# Patient Record
Sex: Female | Born: 1970 | Race: Black or African American | Hispanic: No | Marital: Single | Smoking: Never smoker
Health system: Southern US, Community
[De-identification: ages and names within clinical notes are randomized; demographics above are authoritative.]

## PROBLEM LIST (undated history)

## (undated) DIAGNOSIS — J302 Other seasonal allergic rhinitis: Secondary | ICD-10-CM

## (undated) DIAGNOSIS — D649 Anemia, unspecified: Secondary | ICD-10-CM

## (undated) DIAGNOSIS — I10 Essential (primary) hypertension: Secondary | ICD-10-CM

## (undated) HISTORY — PX: COLONOSCOPY: SHX174

## (undated) HISTORY — PX: DILATION AND CURETTAGE OF UTERUS: SHX78

## (undated) HISTORY — PX: OTHER SURGICAL HISTORY: SHX169

## (undated) HISTORY — PX: WISDOM TOOTH EXTRACTION: SHX21

## (undated) HISTORY — PX: TONSILLECTOMY: SUR1361

---

## 2017-01-20 ENCOUNTER — Ambulatory Visit (INDEPENDENT_AMBULATORY_CARE_PROVIDER_SITE_OTHER): Payer: Self-pay | Admitting: Family Medicine

## 2017-01-20 VITALS — BP 128/84 | HR 82 | Temp 98.5°F | Resp 17 | Ht 67.5 in | Wt 295.0 lb

## 2017-01-20 DIAGNOSIS — J069 Acute upper respiratory infection, unspecified: Secondary | ICD-10-CM

## 2017-01-20 DIAGNOSIS — E782 Mixed hyperlipidemia: Secondary | ICD-10-CM

## 2017-01-20 DIAGNOSIS — E785 Hyperlipidemia, unspecified: Secondary | ICD-10-CM | POA: Insufficient documentation

## 2017-01-20 DIAGNOSIS — I10 Essential (primary) hypertension: Secondary | ICD-10-CM

## 2017-01-20 NOTE — Patient Instructions (Addendum)
We recommend that you schedule a mammogram for breast cancer screening. Typically, you do not need a referral to do this. Please contact a local imaging center to schedule your mammogram.  [facility] - [phone]  *ask for the Radiology Department [facility]) - [phone] or [phone]  [facility] - [phone] [place] [phone] [facility] - [phone]  *ask for the [facility] - [phone]  *ask for the Radiology Department [facility] - [phone]  *ask for the [place] - [phone]    IF you received an x-ray today, you will receive an invoice from [facility] Radiology. Please contact [facility] Radiology at [phone] with questions or concerns regarding your invoice.   IF you received labwork today, you will receive an invoice from [facility]. Please contact [facility] at [phone] with questions or concerns regarding your invoice.   Our billing staff will not be able to assist you with questions regarding bills from these companies.  You will be contacted with the lab results as soon as they are available. The fastest way to get your results is to activate your My Chart account. Instructions are located on the last page of this paperwork. If you have not heard from [place] regarding the results in 2 weeks, please contact this office.      Upper Respiratory Infection, Adult Most upper respiratory infections (URIs) are caused by a virus. A URI affects the nose, throat, and upper air passages. The most common type of URI is often called "the common cold." Follow these instructions at home:  Take medicines only as told by your doctor.  Gargle warm saltwater or take cough drops to comfort your throat as told by your doctor.  Use a warm mist humidifier or inhale steam from a shower to increase air  moisture. This may make it easier to breathe.  Drink enough fluid to keep your pee (urine) clear or pale yellow.  Eat soups and other clear broths.  Have a healthy diet.  Rest as needed.  Go back to work when your fever is gone or your doctor says it is okay.  You may need to stay home longer to avoid giving your URI to others.  You can also wear a face mask and wash your hands often to prevent spread of the virus.  Use your inhaler more if you have asthma.  Do not use any tobacco products, including cigarettes, chewing tobacco, or electronic cigarettes. If you need help quitting, ask your doctor. Contact a doctor if:  You are getting worse, not better.  Your symptoms are not helped by medicine.  You have chills.  You are getting more short of breath.  You have brown or red mucus.  You have yellow or brown discharge from your nose.  You have pain in your face, especially when you bend forward.  You have a fever.  You have puffy (swollen) neck glands.  You have pain while swallowing.  You have white areas in the back of your throat. Get help right away if:  You have very bad or constant:  Headache.  Ear pain.  Pain in your forehead, behind your eyes, and over your cheekbones (sinus pain).  Chest pain.  You have long-lasting (chronic) lung disease and any of the following:  Wheezing.  Long-lasting cough.  Coughing up blood.  A change in your usual mucus.  You have  a stiff neck.  You have changes in your:  Vision.  Hearing.  Thinking.  Mood. This information is not intended to replace advice given to you by your health care provider. Make sure you discuss any questions you have with your health care provider. Document Released: 05/23/2008 Document Revised: 08/07/2016 Document Reviewed: 03/12/2014 Elsevier Interactive Patient Education  2017 [employer].

## 2017-01-20 NOTE — Progress Notes (Signed)
  Chief Complaint  Patient presents with  . Cough  . Nasal Congestion  . URI    HPI  Upper Respiratory Infection: Patient complains of symptoms of a URI. Symptoms include sore throat. Onset of symptoms was 5 days ago, gradually worsening since that time.  She reports that she used afrin spray for nasal congestion. She also c/o cough described as nonproductive and nasal congestion for the past 3 days .  She is drinking plenty of fluids. Evaluation to date: none. Treatment to date: nasal spray with afrin.  She received her flu vaccine.  She reports that she has felt better since using the nasal spray - no diarrhea - no vomiting - no myalgias - no palpitations  No past medical history on file.  Current Outpatient Prescriptions  Medication Sig Dispense Refill  . aspirin 81 MG chewable tablet Chew by mouth daily.    Marland Kitchen lisinopril (PRINIVIL,ZESTRIL) 20 MG tablet Take 20 mg by mouth daily.    . pravastatin (PRAVACHOL) 20 MG tablet Take 20 mg by mouth daily.     No current facility-administered medications for this visit.     Allergies: Not on File  No past surgical history on file.  Social History   Social History  . Marital status: Single    Spouse name: N/A  . Number of children: N/A  . Years of education: N/A   Social History Main Topics  . Smoking status: Never Smoker  . Smokeless tobacco: Never Used  . Alcohol use No  . Drug use: No  . Sexual activity: No   Other Topics Concern  . None   Social History Narrative  . None    ROS See hpi  Objective: Vitals:   01/20/17 1159  BP: 128/84  Pulse: 82  Resp: 17  Temp: 98.5 F (36.9 C)  TempSrc: Oral  SpO2: 99%  Weight: 295 lb (133.8 kg)  Height: 5' 7.5" (1.715 m)   Body mass index is 45.52 kg/m.  Physical Exam General: alert, oriented, in NAD Head: normocephalic, atraumatic, no sinus tenderness Eyes: EOM intact, no scleral icterus or conjunctival injection Ears: TM clear bilaterally Throat: no  pharyngeal exudate or erythema Lymph: no posterior auricular, submental or cervical lymph adenopathy Heart: normal rate, normal sinus rhythm, no murmurs Lungs: clear to auscultation bilaterally, no wheezing   Assessment and Plan Bevin was seen today for cough, nasal congestion and uri.  Diagnoses and all orders for this visit:  Acute URI- advised supportive care Discussed that she should continue hydration     [place]

## 2017-03-03 ENCOUNTER — Ambulatory Visit: Payer: Self-pay | Admitting: Cardiovascular Disease

## 2017-03-10 ENCOUNTER — Ambulatory Visit: Payer: Self-pay | Admitting: Cardiovascular Disease

## 2017-03-14 ENCOUNTER — Encounter: Payer: Self-pay | Admitting: Obstetrics and Gynecology

## 2017-05-04 ENCOUNTER — Telehealth: Payer: Self-pay | Admitting: Hematology

## 2017-05-04 ENCOUNTER — Encounter: Payer: Self-pay | Admitting: Hematology

## 2017-05-04 NOTE — Telephone Encounter (Signed)
Appt has been scheduled for the pt to see Dr. Irene Limbo on 6/28 at [place] [facility] [place] from [facility] who will notify the pt. Letter mailed. Asked Cecille Rubin to fax the referral to our office.

## 2017-06-15 ENCOUNTER — Encounter: Payer: Self-pay | Admitting: Hematology

## 2017-06-15 ENCOUNTER — Ambulatory Visit (HOSPITAL_BASED_OUTPATIENT_CLINIC_OR_DEPARTMENT_OTHER): Payer: BLUE CROSS/BLUE SHIELD | Admitting: Hematology

## 2017-06-15 VITALS — BP 128/81 | HR 81 | Temp 99.6°F | Resp 20 | Ht 67.5 in | Wt 289.3 lb

## 2017-06-15 DIAGNOSIS — I82492 Acute embolism and thrombosis of other specified deep vein of left lower extremity: Secondary | ICD-10-CM | POA: Diagnosis not present

## 2017-06-15 DIAGNOSIS — I82502 Chronic embolism and thrombosis of unspecified deep veins of left lower extremity: Secondary | ICD-10-CM

## 2017-06-15 DIAGNOSIS — D6859 Other primary thrombophilia: Secondary | ICD-10-CM

## 2017-06-15 NOTE — Progress Notes (Signed)
Marland Kitchen    HEMATOLOGY/ONCOLOGY CONSULTATION NOTE  Date of Service: 06/15/2017  Patient Care Team: System, Provider Not In as PCP - General  CHIEF COMPLAINTS/PURPOSE OF CONSULTATION:  H/o DVT , pre-operative evaluation  HISTORY OF PRESENTING ILLNESS:  Cathy Bryant is a wonderful 46 y.o. female who has been referred to [place] by Dr Delsa Bern MD for evaluation and management of h/o DVT - pre-operative evaluation - planning hysterectomy for symptomatic fibroids.  Patient is a poor historian report history of chronic anemia with a hemoglobin of 9.2 on 04/28/2017 thought to be likely related to symptomatic uterine fibroids. She notes that she has had menorrhagia and is on norethindrone for the last 2 months. She also reports a history of hypertension and previous history of DVT.   Patient reports that she has previously had a DVT in her left calf diagnosed in December 2013. Patient reports that she had a primi child delivered in November 2011 at 28 weeks after complicated pregnancy. She notes that she presented with left calf swelling in December 2012 at an outside hospital and reports an ultrasound at the time did not diagnosed DVT. 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 She notes that she was put on aspirin and has not been treated with therapeutic anticoagulation for her DVT.  She does not recollect if she was on oral contraceptive pills around the time of her DVT. She reports that she had broken an ankle from a fall.  Patient reports no family history of venous thromboembolism. Patient denies being a smoker at this time. She is currently on a baby aspirin  Currently notes no acute Symptoms swelling or redness.  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 No treated with anticoagulation. #4 morbid obesity .Body mass index is 44.64 kg/m. #5 history of broken ankle in 2010 from a fall #6 history of preterm delivery at 28 weeks in November 2011   SURGICAL HISTORY: Past Surgical History:  Procedure Laterality Date  . CESAREAN SECTION  2011    SOCIAL HISTORY: Social History   Social History  . Marital status: Single    Spouse name: N/A  . Number of children: N/A  . Years of education: N/A   Occupational History  . Not on file.   Social History Main Topics  . Smoking status: Never Smoker  . Smokeless tobacco: Never Used  . Alcohol use No  . Drug use: No  . Sexual activity: No   Other Topics Concern  . Not on file   Social History Narrative  . No narrative on file  Patient notes active smoking or significant alcohol use.   FAMILY HISTORY: History reviewed. No pertinent family history.  ALLERGIES:  has no allergies on file.  MEDICATIONS:  Current Outpatient Prescriptions  Medication Sig Dispense Refill  . norethindrone (AYGESTIN) 5 MG tablet Take 5 mg by mouth 2 (two) times daily.    Marland Kitchen aspirin 81 MG chewable tablet Chew by mouth daily.    Marland Kitchen lisinopril (PRINIVIL,ZESTRIL) 20 MG tablet Take 20 mg by mouth daily.    . pravastatin (PRAVACHOL) 20 MG tablet Take 20 mg by mouth daily.     No current facility-administered medications for  this visit.     REVIEW OF SYSTEMS:    10 Point review of Systems was done is negative except as noted above.  PHYSICAL EXAMINATION: ECOG PERFORMANCE STATUS: 1 - Symptomatic but completely ambulatory  . Vitals:   06/15/17 1131  BP: 128/81  Pulse: 81  Resp: 20  Temp: 99.6 F (37.6 C)   Filed Weights   06/15/17 1131  Weight: 289 lb 4.8 oz (131.2 kg)   .Body mass index is 44.64 kg/m.  GENERAL:alert, in no acute distress and comfortable SKIN: no acute rashes, no significant lesions EYES: conjunctiva are  pink and non-injected, sclera anicteric OROPHARYNX: MMM, no exudates, no oropharyngeal erythema or ulceration NECK: supple, no JVD LYMPH:  no palpable lymphadenopathy in the cervical, axillary or inguinal regions LUNGS: clear to auscultation b/l with normal respiratory effort HEART: regular rate & rhythm ABDOMEN:  normoactive bowel sounds , non tender, not distended. Extremity: no pedal edema no overt calf pain and swelling or redness of this time PSYCH: alert & oriented x 3 with fluent speech NEURO: no focal motor/sensory deficits  LABORATORY DATA:  I have reviewed the data as listed  . CBC Latest Ref Rng & Units 06/30/2017  WBC 3.9 - 10.3 10e3/uL 5.0  Hemoglobin 11.6 - 15.9 g/dL 10.4(L)  Hematocrit 34.8 - 46.6 % 33.2(L)  Platelets 145 - 400 10e3/uL 432(H)   . CBC    Component Value Date/Time   WBC 5.0 06/30/2017 0840   RBC 4.54 06/30/2017 0840   HGB 10.4 (L) 06/30/2017 0840   HCT 33.2 (L) 06/30/2017 0840   PLT 432 (H) 06/30/2017 0840   MCV 73.2 (L) 06/30/2017 0840   MCH 22.9 (L) 06/30/2017 0840   MCHC 31.3 (L) 06/30/2017 0840   RDW 17.3 (H) 06/30/2017 0840   LYMPHSABS 1.6 06/30/2017 0840   MONOABS 0.2 06/30/2017 0840   EOSABS 0.2 06/30/2017 0840   BASOSABS 0.0 06/30/2017 0840    . CMP Latest Ref Rng & Units 06/30/2017  Glucose 70 - 140 mg/dl 106  BUN 7.0 - 26.0 mg/dL 10.5  Creatinine 0.6 - 1.1 mg/dL 0.9  Sodium 136 - 145 mEq/L 139  Potassium 3.5 - 5.1 mEq/L 3.7  CO2 22 - 29 mEq/L 23  Calcium 8.4 - 10.4 mg/dL 8.8  Total Protein 6.4 - 8.3 g/dL 7.0  Total Bilirubin 0.20 - 1.20 mg/dL 0.30  Alkaline Phos 40 - 150 U/L 39(L)  AST 5 - 34 U/L 80(H)  ALT 0 - 55 U/L 148(H)         Component     Latest Ref Rng & Units 06/30/2017  Anticardiolipin Ab,IgG,Qn     0 - 14 GPL U/mL <9  Anticardiolipin Ab,IgM,Qn     0 - 12 MPL U/mL <9  Anticardiolipin Ab,IgA,Qn     0 - 11 APL U/mL <9  Beta-2 Glycoprotein I Ab, IgG     0 - 20 GPI IgG units <9  Beta-2 Glyco 1 IgA      0 - 25 GPI IgA units <9  Beta-2 Glyco 1 IgM     0 - 32 GPI IgM units <9    RADIOGRAPHIC STUDIES: I have personally reviewed the radiological images as listed and agreed with the findings in the report.  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 - No evidence of superficial thrombosis involving the left lower  extremity. - No evidence of Baker&'s cyst on the left.  Other specific details can be found in the table(s) above. 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 This will apparently diagnosed late and was noted to be chronic at the time of diagnosis the patient had symptoms for about a year prior to this. Has not been treated with anticoagulation for this. Unclear provoking factors. -We evaluated for factor V Leiden, prothrombin gene mutations and antiphospholipid antibodies - workup was unrevealing. 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  Plan -No indication for therapeutic anticoagulation at this time. -No obvious inherited or pathologic acquired thrombophilia noted. -Reasonable to continue baby aspirin. -Patient would be okay to proceed with planned hysterectomy with appropriate perioperative DVT prophylaxis with Lovenox, from a thrombophilia standpoint. -counseled on need for diet/exercise and weight loss  #2 Iron deficiency anemia related  to menorrhagia from dysfunctional uterine bleeding related to uterine fibroids. . Lab Results  Component Value Date   IRON 20 (L) 06/30/2017   TIBC 446 (H) 06/30/2017   IRONPCTSAT 4 (L) 06/30/2017   (Iron and TIBC)  Lab Results  Component Value Date   FERRITIN 6 (L) 06/30/2017   # 3 Thrombocytosis - likely reactive for menorrhagia and from iron deficiency  PLan -given patient is going for surgery would optimize Iron status and attempt to correct anemia with IV Iron -will offer patient option to get IV Injectafer weekly x 2 doses to correct Iron deficiency. -reasonable to take daily B complex to support accelerated erythropoeisis.   Labs in 1 week [place] bilateral lower extremity venous in 1-2 weeks RTC with Dr Irene Limbo in 4 weeks with [place] Ext and labs.  All of the patients questions were answered with apparent satisfaction. The patient knows to call the clinic with any problems, questions or concerns.  I spent 45 minutes counseling the patient face to face. The total time spent in the appointment was 60 minutes and more than 50% was on counseling and direct patient cares.    Sullivan Lone MD [place] AAHIVMS [facility] Hematology/Oncology Physician [facility]  (Office):       [phone] (Work cell):  [phone] (Fax):           [phone]  06/15/2017 11:42 AM

## 2017-06-15 NOTE — Patient Instructions (Signed)
Thank you for choosing [facility] to provide your oncology and hematology care.  To afford each patient quality time with our providers, please arrive 30 minutes before your scheduled appointment time.  If you arrive late for your appointment, you may be asked to reschedule.  We strive to give you quality time with our providers, and arriving late affects you and other patients whose appointments are after yours.  If you are a no show for multiple scheduled visits, you may be dismissed from the clinic at the providers discretion.   Again, thank you for choosing [facility], our hope is that these requests will decrease the amount of time that you wait before being seen by our physicians.  ______________________________________________________________________ Should you have questions after your visit to the [facility], please contact our office at [phone] between the hours of 8:30 and 4:30 p.m.    Voicemails left after 4:30p.m will not be returned until the following business day.   For prescription refill requests, please have your pharmacy contact us directly.  Please also try to allow 48 hours for prescription requests.   Please contact the scheduling department for questions regarding scheduling.  For scheduling of procedures such as PET scans, CT scans, MRI, Ultrasound, etc please contact central scheduling at [phone].   Resources For Cancer Patients and Caregivers:  American Cancer Society:  800-227-2345  Can help patients locate various types of support and financial assistance Cancer Care: 1-800-813-HOPE (4673) Provides financial assistance, online support groups, medication/co-pay assistance.   [place] DSS:  336-641-3447 Where to apply for food stamps, Medicaid, and utility assistance Medicare Rights Center: 800-333-4114 Helps people with Medicare understand their rights and benefits, navigate the Medicare system, and secure the  quality healthcare they deserve SCAT: 336-333-6589 [place] [employer]'s shared-ride transportation service for eligible riders who have a disability that prevents them from riding the fixed route bus.   For additional information on assistance programs please contact our social worker:   Grier Hock/Abigail Elmore:  [phone] 

## 2017-06-16 ENCOUNTER — Encounter: Payer: Self-pay | Admitting: *Deleted

## 2017-06-19 ENCOUNTER — Telehealth: Payer: Self-pay | Admitting: Hematology

## 2017-06-19 NOTE — Telephone Encounter (Signed)
Scheduled appt per 6/28 los - patient aware - sent reminder letter in the mail.

## 2017-06-23 ENCOUNTER — Other Ambulatory Visit: Payer: BLUE CROSS/BLUE SHIELD

## 2017-06-29 ENCOUNTER — Telehealth: Payer: Self-pay

## 2017-06-29 NOTE — Telephone Encounter (Signed)
Request and authorization for health information disclosure form sent to facility where pt had been previously seen. Confirmed fax receipt.

## 2017-06-30 ENCOUNTER — Other Ambulatory Visit (HOSPITAL_BASED_OUTPATIENT_CLINIC_OR_DEPARTMENT_OTHER): Payer: BLUE CROSS/BLUE SHIELD

## 2017-06-30 ENCOUNTER — Ambulatory Visit (HOSPITAL_COMMUNITY)
Admission: RE | Admit: 2017-06-30 | Discharge: 2017-06-30 | Disposition: A | Discharge status: Home / Self Care | Payer: BLUE CROSS/BLUE SHIELD | Source: Ambulatory Visit | Attending: Hematology | Admitting: Hematology

## 2017-06-30 DIAGNOSIS — N920 Excessive and frequent menstruation with regular cycle: Secondary | ICD-10-CM | POA: Diagnosis not present

## 2017-06-30 DIAGNOSIS — I82502 Chronic embolism and thrombosis of unspecified deep veins of left lower extremity: Secondary | ICD-10-CM

## 2017-06-30 DIAGNOSIS — D500 Iron deficiency anemia secondary to blood loss (chronic): Secondary | ICD-10-CM | POA: Diagnosis not present

## 2017-06-30 DIAGNOSIS — D6859 Other primary thrombophilia: Secondary | ICD-10-CM

## 2017-06-30 LAB — COMPREHENSIVE METABOLIC PANEL WITH GFR
ALT: 148 U/L — ABNORMAL HIGH (ref 0–55)
AST: 80 U/L — ABNORMAL HIGH (ref 5–34)
Albumin: 3.3 g/dL — ABNORMAL LOW (ref 3.5–5.0)
Alkaline Phosphatase: 39 U/L — ABNORMAL LOW (ref 40–150)
Anion Gap: 8 meq/L (ref 3–11)
BUN: 10.5 mg/dL (ref 7.0–26.0)
CO2: 23 meq/L (ref 22–29)
Calcium: 8.8 mg/dL (ref 8.4–10.4)
Chloride: 108 meq/L (ref 98–109)
Creatinine: 0.9 mg/dL (ref 0.6–1.1)
EGFR: 86 ml/min/1.73 m2 — ABNORMAL LOW
Glucose: 106 mg/dL (ref 70–140)
Potassium: 3.7 meq/L (ref 3.5–5.1)
Sodium: 139 meq/L (ref 136–145)
Total Bilirubin: 0.3 mg/dL (ref 0.20–1.20)
Total Protein: 7 g/dL (ref 6.4–8.3)

## 2017-06-30 LAB — CBC & DIFF AND RETIC
BASO%: 0.8 % (ref 0.0–2.0)
Basophils Absolute: 0 10*3/uL (ref 0.0–0.1)
EOS%: 3 % (ref 0.0–7.0)
Eosinophils Absolute: 0.2 10*3/uL (ref 0.0–0.5)
HCT: 33.2 % — ABNORMAL LOW (ref 34.8–46.6)
HGB: 10.4 g/dL — ABNORMAL LOW (ref 11.6–15.9)
IMMATURE RETIC FRACT: 18.5 % — AB (ref 1.60–10.00)
LYMPH#: 1.6 10*3/uL (ref 0.9–3.3)
LYMPH%: 32.9 % (ref 14.0–49.7)
MCH: 22.9 pg — ABNORMAL LOW (ref 25.1–34.0)
MCHC: 31.3 g/dL — AB (ref 31.5–36.0)
MCV: 73.2 fL — ABNORMAL LOW (ref 79.5–101.0)
MONO#: 0.2 10*3/uL (ref 0.1–0.9)
MONO%: 3.4 % (ref 0.0–14.0)
NEUT%: 59.9 % (ref 38.4–76.8)
NEUTROS ABS: 3 10*3/uL (ref 1.5–6.5)
Platelets: 432 10*3/uL — ABNORMAL HIGH (ref 145–400)
RBC: 4.54 10*6/uL (ref 3.70–5.45)
RDW: 17.3 % — ABNORMAL HIGH (ref 11.2–14.5)
RETIC CT ABS: 69.92 10*3/uL (ref 33.70–90.70)
Retic %: 1.54 % (ref 0.70–2.10)
WBC: 5 10*3/uL (ref 3.9–10.3)

## 2017-06-30 LAB — IRON AND TIBC
%SAT: 4 % — ABNORMAL LOW (ref 21–57)
Iron: 20 ug/dL — ABNORMAL LOW (ref 41–142)
TIBC: 446 ug/dL — ABNORMAL HIGH (ref 236–444)
UIBC: 427 ug/dL — ABNORMAL HIGH (ref 120–384)

## 2017-06-30 LAB — FERRITIN: Ferritin: 6 ng/ml — ABNORMAL LOW (ref 9–269)

## 2017-06-30 NOTE — Progress Notes (Signed)
VASCULAR LAB PRELIMINARY  PRELIMINARY  PRELIMINARY  PRELIMINARY  Left lower extremity venous duplex completed.    Preliminary report:  Left - There is no evidence of an acute DVT involving the left lower extremity. A chronic calf thrombus was noted in a small segment of the gastrocnemius vein. No evidence of a superficial thrombosis or Baker's cyst was noted involving the left lower extremity.  Compton Brigance, RVS 06/30/2017, 9:39 AM

## 2017-07-01 LAB — LUPUS ANTICOAGULANT PANEL
DRVVT: 34.2 s (ref 0.0–47.0)
PTT-LA: 27 s (ref 0.0–51.9)

## 2017-07-05 LAB — CARDIOLIPIN ANTIBODIES, IGG, IGM, IGA: Anticardiolipin Ab,IgG,Qn: <9 GPL U/mL (ref 0–14)

## 2017-07-05 LAB — BETA-2-GLYCOPROTEIN I ABS, IGG/M/A
Beta-2 Glyco 1 IgA: <9 GPI IgA units (ref 0–25)
Beta-2 Glyco 1 IgM: <9 GPI IgM units (ref 0–32)
Beta-2 Glycoprotein I Ab, IgG: <9 GPI IgG units (ref 0–20)

## 2017-07-07 LAB — FACTOR 5 LEIDEN

## 2017-07-07 LAB — PROTHROMBIN GENE MUTATION

## 2017-07-14 ENCOUNTER — Ambulatory Visit: Payer: BLUE CROSS/BLUE SHIELD | Admitting: Hematology

## 2017-07-14 ENCOUNTER — Other Ambulatory Visit: Payer: BLUE CROSS/BLUE SHIELD

## 2017-07-18 ENCOUNTER — Telehealth: Payer: Self-pay

## 2017-07-18 ENCOUNTER — Other Ambulatory Visit: Payer: Self-pay | Admitting: Hematology

## 2017-07-18 ENCOUNTER — Encounter: Payer: Self-pay | Admitting: Hematology

## 2017-07-18 DIAGNOSIS — D500 Iron deficiency anemia secondary to blood loss (chronic): Secondary | ICD-10-CM | POA: Insufficient documentation

## 2017-07-18 NOTE — Telephone Encounter (Signed)
Pt in agreement to have IV iron. Scheduling message sent. Pt to expect appt 8/3 and 8/10.

## 2017-07-18 NOTE — Progress Notes (Signed)
Received a request from [facility] unable to authorized [phone] and [phone] until they receive additional information. Faxed medical notes to [facility] [phone] ATTN predetermination Unit.

## 2017-07-19 ENCOUNTER — Telehealth: Payer: Self-pay

## 2017-07-19 NOTE — Telephone Encounter (Signed)
Let pt know about appt 8/10 at 0800 for injectafer. Additional scheduling message sent for injectafer first-time on 8/3 after doctor's visit. Told pt to call if she hasn't heard anything tomorrow.

## 2017-07-20 ENCOUNTER — Other Ambulatory Visit: Payer: Self-pay

## 2017-07-20 DIAGNOSIS — D500 Iron deficiency anemia secondary to blood loss (chronic): Secondary | ICD-10-CM

## 2017-07-21 ENCOUNTER — Other Ambulatory Visit (HOSPITAL_BASED_OUTPATIENT_CLINIC_OR_DEPARTMENT_OTHER): Payer: BLUE CROSS/BLUE SHIELD

## 2017-07-21 ENCOUNTER — Ambulatory Visit (HOSPITAL_BASED_OUTPATIENT_CLINIC_OR_DEPARTMENT_OTHER): Payer: BLUE CROSS/BLUE SHIELD

## 2017-07-21 ENCOUNTER — Ambulatory Visit (HOSPITAL_BASED_OUTPATIENT_CLINIC_OR_DEPARTMENT_OTHER): Payer: BLUE CROSS/BLUE SHIELD | Admitting: Hematology

## 2017-07-21 VITALS — BP 109/64 | HR 77 | Temp 98.9°F | Resp 17

## 2017-07-21 VITALS — BP 140/75 | HR 83 | Temp 99.4°F | Resp 18 | Ht 67.5 in | Wt 293.7 lb

## 2017-07-21 DIAGNOSIS — D6859 Other primary thrombophilia: Secondary | ICD-10-CM

## 2017-07-21 DIAGNOSIS — N920 Excessive and frequent menstruation with regular cycle: Secondary | ICD-10-CM | POA: Diagnosis not present

## 2017-07-21 DIAGNOSIS — I82502 Chronic embolism and thrombosis of unspecified deep veins of left lower extremity: Secondary | ICD-10-CM

## 2017-07-21 DIAGNOSIS — D500 Iron deficiency anemia secondary to blood loss (chronic): Secondary | ICD-10-CM

## 2017-07-21 DIAGNOSIS — D473 Essential (hemorrhagic) thrombocythemia: Secondary | ICD-10-CM

## 2017-07-21 DIAGNOSIS — Z86718 Personal history of other venous thrombosis and embolism: Secondary | ICD-10-CM

## 2017-07-21 LAB — COMPREHENSIVE METABOLIC PANEL
ALBUMIN: 3.3 g/dL — AB (ref 3.5–5.0)
ALK PHOS: 40 U/L (ref 40–150)
ALT: 78 U/L — AB (ref 0–55)
AST: 49 U/L — ABNORMAL HIGH (ref 5–34)
Anion Gap: 6 mEq/L (ref 3–11)
BILIRUBIN TOTAL: 0.61 mg/dL (ref 0.20–1.20)
BUN: 11.3 mg/dL (ref 7.0–26.0)
CALCIUM: 8.7 mg/dL (ref 8.4–10.4)
CO2: 25 mEq/L (ref 22–29)
CREATININE: 0.9 mg/dL (ref 0.6–1.1)
Chloride: 108 mEq/L (ref 98–109)
EGFR: 83 mL/min/{1.73_m2} — ABNORMAL LOW (ref >90)
Glucose: 86 mg/dl (ref 70–140)
POTASSIUM: 3.6 meq/L (ref 3.5–5.1)
Sodium: 140 mEq/L (ref 136–145)
TOTAL PROTEIN: 6.9 g/dL (ref 6.4–8.3)

## 2017-07-21 LAB — CBC WITH DIFFERENTIAL/PLATELET
BASO%: 1.1 % (ref 0.0–2.0)
BASOS ABS: 0.1 10*3/uL (ref 0.0–0.1)
EOS%: 1.7 % (ref 0.0–7.0)
Eosinophils Absolute: 0.1 10*3/uL (ref 0.0–0.5)
HEMATOCRIT: 34.9 % (ref 34.8–46.6)
HEMOGLOBIN: 11 g/dL — AB (ref 11.6–15.9)
LYMPH#: 1.6 10*3/uL (ref 0.9–3.3)
LYMPH%: 31.6 % (ref 14.0–49.7)
MCH: 22.7 pg — AB (ref 25.1–34.0)
MCHC: 31.5 g/dL (ref 31.5–36.0)
MCV: 72 fL — ABNORMAL LOW (ref 79.5–101.0)
MONO#: 0.2 10*3/uL (ref 0.1–0.9)
MONO%: 4.8 % (ref 0.0–14.0)
NEUT#: 3.1 10*3/uL (ref 1.5–6.5)
NEUT%: 60.8 % (ref 38.4–76.8)
Platelets: 396 10*3/uL (ref 145–400)
RBC: 4.84 10*6/uL (ref 3.70–5.45)
RDW: 18.3 % — AB (ref 11.2–14.5)
WBC: 5.1 10*3/uL (ref 3.9–10.3)

## 2017-07-21 LAB — IRON AND TIBC
%SAT: 10 % — AB (ref 21–57)
Iron: 44 ug/dL (ref 41–142)
TIBC: 426 ug/dL (ref 236–444)
UIBC: 382 ug/dL (ref 120–384)

## 2017-07-21 LAB — FERRITIN: FERRITIN: 7 ng/mL — AB (ref 9–269)

## 2017-07-21 MED ORDER — FERRIC CARBOXYMALTOSE 750 MG/15ML IV SOLN
750.0000 mg | Freq: Once | INTRAVENOUS | Status: AC
  Administered 2017-07-21: 750 mg via INTRAVENOUS
  Filled 2017-07-21: qty 15

## 2017-07-21 NOTE — Patient Instructions (Signed)
Ferric carboxymaltose injection What is this medicine? FERRIC CARBOXYMALTOSE (ferr-ik car-box-ee-mol-toes) is an iron complex. Iron is used to make healthy red blood cells, which carry oxygen and nutrients throughout the body. This medicine is used to treat anemia in people with chronic kidney disease or people who cannot take iron by mouth. This medicine may be used for other purposes; ask your health care provider or pharmacist if you have questions. COMMON BRAND NAME(S): Injectafer What should I tell my health care provider before I take this medicine? They need to know if you have any of these conditions: -anemia not caused by low iron levels -high levels of iron in the blood -liver disease -an unusual or allergic reaction to iron, other medicines, foods, dyes, or preservatives -pregnant or trying to get pregnant -breast-feeding How should I use this medicine? This medicine is for infusion into a vein. It is given by a health care professional in a hospital or clinic setting. Talk to your pediatrician regarding the use of this medicine in children. Special care may be needed. Overdosage: If you think you have taken too much of this medicine contact a poison control center or emergency room at once. NOTE: This medicine is only for you. Do not share this medicine with others. What if I miss a dose? It is important not to miss your dose. Call your doctor or health care professional if you are unable to keep an appointment. What may interact with this medicine? Do not take this medicine with any of the following medications: -deferoxamine -dimercaprol -other iron products This medicine may also interact with the following medications: -chloramphenicol -deferasirox This list may not describe all possible interactions. Give your health care provider a list of all the medicines, herbs, non-prescription drugs, or dietary supplements you use. Also tell them if you smoke, drink alcohol, or use  illegal drugs. Some items may interact with your medicine. What should I watch for while using this medicine? Visit your doctor or health care professional regularly. Tell your doctor if your symptoms do not start to get better or if they get worse. You may need blood work done while you are taking this medicine. You may need to follow a special diet. Talk to your doctor. Foods that contain iron include: whole grains/cereals, dried fruits, beans, or peas, leafy green vegetables, and organ meats (liver, kidney). What side effects may I notice from receiving this medicine? Side effects that you should report to your doctor or health care professional as soon as possible: -allergic reactions like skin rash, itching or hives, swelling of the face, lips, or tongue -breathing problems -changes in blood pressure -feeling faint or lightheaded, falls -flushing, sweating, or hot feelings Side effects that usually do not require medical attention (report to your doctor or health care professional if they continue or are bothersome): -changes in taste -constipation -dizziness -headache -nausea -pain, redness, or irritation at site where injected -vomiting This list may not describe all possible side effects. Call your doctor for medical advice about side effects. You may report side effects to FDA at 1-800-FDA-1088. Where should I keep my medicine? This drug is given in a hospital or clinic and will not be stored at home. NOTE: This sheet is a summary. It may not cover all possible information. If you have questions about this medicine, talk to your doctor, pharmacist, or health care provider.  2018 Elsevier/Gold Standard (2016-01-07 11:20:47)  

## 2017-07-26 NOTE — Progress Notes (Signed)
Marland Kitchen    HEMATOLOGY/ONCOLOGY CLINIC NOTE  Date of Service: .07/21/2017  Patient Care Team: Patient, No Pcp Per as PCP - General (General Practice)  CHIEF COMPLAINTS/PURPOSE OF CONSULTATION:  H/o DVT , pre-operative evaluation  HISTORY OF PRESENTING ILLNESS:  Cathy Bryant is a wonderful 46 y.o. female who has been referred to [place] by Dr Delsa Bern MD for evaluation and management of h/o DVT - pre-operative evaluation - planning hysterectomy for symptomatic fibroids.  Patient is a poor historian report history of chronic anemia with a hemoglobin of 9.2 on 04/28/2017 thought to be likely related to symptomatic uterine fibroids. She notes that she has had menorrhagia and is on norethindrone for the last 2 months. She also reports a history of hypertension and previous history of DVT.   Patient reports that she has previously had a DVT in her left calf diagnosed in December 2013. Patient reports that she had a primi child delivered in November 2011 at 28 weeks after complicated pregnancy. She notes that she presented with left calf swelling in December 2012 at an outside hospital and reports an ultrasound at the time did not diagnosed DVT. 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 She notes that she was put on aspirin and has not been treated with therapeutic anticoagulation for her DVT.  She does not recollect if she was on oral contraceptive pills around the time of her DVT. She reports that she had broken an ankle from a fall.  Patient reports no family history of venous thromboembolism. Patient denies being a smoker at this time. She is currently on a baby aspirin  INTERVAL HISTORY  Patient is here for f/u regarding a previous history of DVT and iron deficiency anemia. She is significantly iron deficient anemic and would like to  optimize this prior to any GYN surgery. Informed consent was obtained for IV iron and she will get 2 doses one week apart. We discussed her thrombophilia workup in details and answered all her questions to her apparent satisfaction.  MEDICAL HISTORY:   #1 hypertension #2 uterine fibroids with menorrhagia and blood loss anemia and dysfunctional uterine bleeding #3 history of left calf DVT in December 2013 - diagnosed late. No treated with anticoagulation. #4 morbid obesity .Body mass index is 45.32 kg/m. #5 history of broken ankle in 2010 from a fall #6 history of preterm delivery at 28 weeks in November 2011   SURGICAL HISTORY: Past Surgical History:  Procedure Laterality Date  . CESAREAN SECTION  2011    SOCIAL HISTORY: Social History   Social History  . Marital status: Single    Spouse name: N/A  . Number of children: N/A  . Years of education: N/A   Occupational History  . Not on file.   Social History Main Topics  . Smoking status: Never Smoker  . Smokeless tobacco: Never Used  . Alcohol use No  . Drug use: No  . Sexual activity: No   Other Topics Concern  . Not on file   Social History Narrative  . No narrative on file  Patient notes active smoking or significant alcohol use.   FAMILY HISTORY: No family history on file.  ALLERGIES:  has no allergies on file.  MEDICATIONS:  Current Outpatient Prescriptions  Medication Sig Dispense Refill  . aspirin 81 MG chewable tablet Chew  by mouth daily.    Marland Kitchen lisinopril (PRINIVIL,ZESTRIL) 20 MG tablet Take 20 mg by mouth daily.    . norethindrone (AYGESTIN) 5 MG tablet Take 5 mg by mouth 2 (two) times daily.    . pravastatin (PRAVACHOL) 20 MG tablet Take 20 mg by mouth daily.     No current facility-administered medications for this visit.     REVIEW OF SYSTEMS:    10 Point review of Systems was done is negative except as noted above.  PHYSICAL EXAMINATION: ECOG PERFORMANCE STATUS: 1 - Symptomatic but  completely ambulatory  . Vitals:   07/21/17 0955  BP: 140/75  Pulse: 83  Resp: 18  Temp: 99.4 F (37.4 C)   Filed Weights   07/21/17 0955  Weight: 293 lb 11.2 oz (133.2 kg)   .Body mass index is 45.32 kg/m.  GENERAL:alert, in no acute distress and comfortable SKIN: no acute rashes, no significant lesions EYES: conjunctiva are pink and non-injected, sclera anicteric OROPHARYNX: MMM, no exudates, no oropharyngeal erythema or ulceration NECK: supple, no JVD LYMPH:  no palpable lymphadenopathy in the cervical, axillary or inguinal regions LUNGS: clear to auscultation b/l with normal respiratory effort HEART: regular rate & rhythm ABDOMEN:  normoactive bowel sounds , non tender, not distended. Extremity: no pedal edema no overt calf pain and swelling or redness of this time PSYCH: alert & oriented x 3 with fluent speech NEURO: no focal motor/sensory deficits  LABORATORY DATA:  I have reviewed the data as listed  . CBC Latest Ref Rng & Units 07/21/2017 06/30/2017  WBC 3.9 - 10.3 10e3/uL 5.1 5.0  Hemoglobin 11.6 - 15.9 g/dL 11.0(L) 10.4(L)  Hematocrit 34.8 - 46.6 % 34.9 33.2(L)  Platelets 145 - 400 10e3/uL 396 432(H)   . CBC    Component Value Date/Time   WBC 5.1 07/21/2017 0845   RBC 4.84 07/21/2017 0845   HGB 11.0 (L) 07/21/2017 0845   HCT 34.9 07/21/2017 0845   PLT 396 07/21/2017 0845   MCV 72.0 (L) 07/21/2017 0845   MCH 22.7 (L) 07/21/2017 0845   MCHC 31.5 07/21/2017 0845   RDW 18.3 (H) 07/21/2017 0845   LYMPHSABS 1.6 07/21/2017 0845   MONOABS 0.2 07/21/2017 0845   EOSABS 0.1 07/21/2017 0845   BASOSABS 0.1 07/21/2017 0845    . CMP Latest Ref Rng & Units 07/21/2017 06/30/2017  Glucose 70 - 140 mg/dl 86 106  BUN 7.0 - 26.0 mg/dL 11.3 10.5  Creatinine 0.6 - 1.1 mg/dL 0.9 0.9  Sodium 136 - 145 mEq/L 140 139  Potassium 3.5 - 5.1 mEq/L 3.6 3.7  CO2 22 - 29 mEq/L 25 23  Calcium 8.4 - 10.4 mg/dL 8.7 8.8  Total Protein 6.4 - 8.3 g/dL 6.9 7.0  Total Bilirubin 0.20 -  1.20 mg/dL 0.61 0.30  Alkaline Phos 40 - 150 U/L 40 39(L)  AST 5 - 34 U/L 49(H) 80(H)  ALT 0 - 55 U/L 78(H) 148(H)         Component     Latest Ref Rng & Units 06/30/2017  Anticardiolipin Ab,IgG,Qn     0 - 14 GPL U/mL <9  Anticardiolipin Ab,IgM,Qn     0 - 12 MPL U/mL <9  Anticardiolipin Ab,IgA,Qn     0 - 11 APL U/mL <9  Beta-2 Glycoprotein I Ab, IgG     0 - 20 GPI IgG units <9  Beta-2 Glyco 1 IgA     0 - 25 GPI IgA units <9  Beta-2 Glyco 1 IgM  0 - 32 GPI IgM units <9    RADIOGRAPHIC STUDIES: I have personally reviewed the radiological images as listed and agreed with the findings in the report.  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 - No evidence of superficial thrombosis involving the left lower   extremity. - No evidence of Baker&'s cyst on the left.  Other specific details can be found in the table(s) above. 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 This will apparently diagnosed late and was noted to be chronic at the time of diagnosis the patient had symptoms for about a year prior to this. Has not been treated with anticoagulation for this. Unclear provoking factors. -We evaluated for factor V Leiden, prothrombin gene mutations and antiphospholipid antibodies - workup was unrevealing. 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  Repeat ultrasound of the left lower extremity on 06/30/2017 showed only minimal short segment chronic DVT in the gastrocnemius vein and no other significant burden of chronic  clot.  Plan -No indication for therapeutic anticoagulation at this time. -No obvious inherited or pathologic acquired thrombophilia noted. Results were discussed in details with the patient. -Reasonable to continue baby aspirin. -Patient would be okay to proceed with planned hysterectomy with appropriate perioperative DVT prophylaxis with Lovenox, from a thrombophilia standpoint. -counseled on need for diet/exercise and weight loss  #2 Iron deficiency anemia related to menorrhagia from dysfunctional uterine bleeding related to uterine fibroids. . Lab Results  Component Value Date   IRON 44 07/21/2017   TIBC 426 07/21/2017   IRONPCTSAT 10 (L) 07/21/2017   (Iron and TIBC)  Lab Results  Component Value Date   FERRITIN 7 (L) 07/21/2017   # 3 Thrombocytosis - likely reactive From menorrhagia and from iron deficiency  PLan -given patient is going for surgery would optimize Iron status and attempt to correct anemia with IV Iron -Informed consent obtained for IV Injectafer weekly x 2 doses to correct Iron deficiency. First dose today -reasonable to take daily B complex to support accelerated erythropoeisis.   -complete IV Iron as per existing appointments RTC with Dr Irene Limbo in 8 weeks with labs  All of the patients questions were answered with apparent satisfaction. The patient knows to call the clinic with any problems, questions or concerns.  I spent 20  minutes counseling the patient face to face. The total time spent in the appointment was 25 minutes and more than 50% was on counseling and direct patient cares.    Sullivan Lone MD [place] AAHIVMS [facility] Hematology/Oncology Physician [facility]  (Office):       [phone] (Work cell):  [phone] (Fax):           [phone]  07/26/2017 6:40 PM

## 2017-07-28 ENCOUNTER — Ambulatory Visit (HOSPITAL_BASED_OUTPATIENT_CLINIC_OR_DEPARTMENT_OTHER): Payer: BLUE CROSS/BLUE SHIELD

## 2017-07-28 ENCOUNTER — Telehealth: Payer: Self-pay

## 2017-07-28 VITALS — BP 116/64 | HR 77 | Temp 97.8°F | Resp 18

## 2017-07-28 DIAGNOSIS — D500 Iron deficiency anemia secondary to blood loss (chronic): Secondary | ICD-10-CM | POA: Diagnosis not present

## 2017-07-28 DIAGNOSIS — N920 Excessive and frequent menstruation with regular cycle: Secondary | ICD-10-CM | POA: Diagnosis not present

## 2017-07-28 MED ORDER — SODIUM CHLORIDE 0.9 % IV SOLN
750.0000 mg | Freq: Once | INTRAVENOUS | Status: AC
  Administered 2017-07-28: 750 mg via INTRAVENOUS
  Filled 2017-07-28: qty 15

## 2017-07-28 MED ORDER — SODIUM CHLORIDE 0.9 % IV SOLN
INTRAVENOUS | Status: DC
  Administered 2017-07-28: 08:00:00 via INTRAVENOUS

## 2017-07-28 NOTE — Patient Instructions (Signed)
Ferric carboxymaltose injection What is this medicine? FERRIC CARBOXYMALTOSE (ferr-ik car-box-ee-mol-toes) is an iron complex. Iron is used to make healthy red blood cells, which carry oxygen and nutrients throughout the body. This medicine is used to treat anemia in people with chronic kidney disease or people who cannot take iron by mouth. This medicine may be used for other purposes; ask your health care provider or pharmacist if you have questions. COMMON BRAND NAME(S): Injectafer What should I tell my health care provider before I take this medicine? They need to know if you have any of these conditions: -anemia not caused by low iron levels -high levels of iron in the blood -liver disease -an unusual or allergic reaction to iron, other medicines, foods, dyes, or preservatives -pregnant or trying to get pregnant -breast-feeding How should I use this medicine? This medicine is for infusion into a vein. It is given by a health care professional in a hospital or clinic setting. Talk to your pediatrician regarding the use of this medicine in children. Special care may be needed. Overdosage: If you think you have taken too much of this medicine contact a poison control center or emergency room at once. NOTE: This medicine is only for you. Do not share this medicine with others. What if I miss a dose? It is important not to miss your dose. Call your doctor or health care professional if you are unable to keep an appointment. What may interact with this medicine? Do not take this medicine with any of the following medications: -deferoxamine -dimercaprol -other iron products This medicine may also interact with the following medications: -chloramphenicol -deferasirox This list may not describe all possible interactions. Give your health care provider a list of all the medicines, herbs, non-prescription drugs, or dietary supplements you use. Also tell them if you smoke, drink alcohol, or use  illegal drugs. Some items may interact with your medicine. What should I watch for while using this medicine? Visit your doctor or health care professional regularly. Tell your doctor if your symptoms do not start to get better or if they get worse. You may need blood work done while you are taking this medicine. You may need to follow a special diet. Talk to your doctor. Foods that contain iron include: whole grains/cereals, dried fruits, beans, or peas, leafy green vegetables, and organ meats (liver, kidney). What side effects may I notice from receiving this medicine? Side effects that you should report to your doctor or health care professional as soon as possible: -allergic reactions like skin rash, itching or hives, swelling of the face, lips, or tongue -breathing problems -changes in blood pressure -feeling faint or lightheaded, falls -flushing, sweating, or hot feelings Side effects that usually do not require medical attention (report to your doctor or health care professional if they continue or are bothersome): -changes in taste -constipation -dizziness -headache -nausea -pain, redness, or irritation at site where injected -vomiting This list may not describe all possible side effects. Call your doctor for medical advice about side effects. You may report side effects to FDA at 1-800-FDA-1088. Where should I keep my medicine? This drug is given in a hospital or clinic and will not be stored at home. NOTE: This sheet is a summary. It may not cover all possible information. If you have questions about this medicine, talk to your doctor, pharmacist, or health care provider.  2018 Elsevier/Gold Standard (2016-01-07 11:20:47)  

## 2017-07-28 NOTE — Telephone Encounter (Signed)
Left a message for patient for 9/28 appointment

## 2017-08-17 ENCOUNTER — Ambulatory Visit
Admission: EM | Admit: 2017-08-17 | Discharge: 2017-08-17 | Disposition: A | Discharge status: Home / Self Care | Payer: BLUE CROSS/BLUE SHIELD | Attending: Family Medicine | Admitting: Family Medicine

## 2017-08-17 ENCOUNTER — Encounter: Payer: Self-pay | Admitting: Emergency Medicine

## 2017-08-17 DIAGNOSIS — J301 Allergic rhinitis due to pollen: Secondary | ICD-10-CM

## 2017-08-17 MED ORDER — AMOXICILLIN-POT CLAVULANATE 875-125 MG PO TABS: 1.0000 | ORAL_TABLET | Freq: Two times a day (BID) | ORAL | 0 refills | Status: DC

## 2017-08-17 MED ORDER — PREDNISONE 10 MG (21) PO TBPK: ORAL_TABLET | ORAL | 0 refills | Status: DC

## 2017-08-17 MED ORDER — FLUTICASONE PROPIONATE 50 MCG/ACT NA SUSP: 2.0000 | Freq: Every day | NASAL | 0 refills | Status: AC

## 2017-08-17 MED ORDER — FEXOFENADINE-PSEUDOEPHED ER 180-240 MG PO TB24: 1.0000 | ORAL_TABLET | Freq: Every day | ORAL | 0 refills | Status: DC

## 2017-08-17 NOTE — ED Triage Notes (Signed)
Patient c/o sneezing, runny nose, sinus congestion and pressure and cough that started yesterday.  Patient denies fevers.

## 2017-08-17 NOTE — ED Provider Notes (Signed)
[facility]    CSN: [id number] Arrival date & time: 08/17/17  1209     History   Chief Complaint Chief Complaint  Patient presents with  . Sinus Problem  . Cough    HPI Cathy Bryant is a 46 y.o. female.   46 year old black female with a history of hypertension hyperlipidemia presents with bilateral ear pain or nasal pain nasal congestion and cough started Lupron Tuesday got worse Wednesday she came in to be seen on Thursday. Eyes running. She was concerned because she is taking a six-year-old breech this weekend and thought that she will need to have something done. He has a history deficiency due to chronic blood loss from menses, obesity, hypertension and hyperlipidemia. Previous surgeries C-section. Strong family history of hypertension. She does not smoke. Known drug allergies.   The history is provided by the patient. The history is limited by a language barrier. No language interpreter was used.  Sinus Problem  This is a new problem. The problem occurs constantly. Pertinent negatives include no chest pain, no abdominal pain, no headaches and no shortness of breath. Nothing aggravates the symptoms. Nothing relieves the symptoms. She has tried nothing for the symptoms. The treatment provided significant relief.  Cough  Associated symptoms: ear pain   Associated symptoms: no chest pain, no headaches and no shortness of breath     History reviewed. No pertinent past medical history.  Patient Active Problem List   Diagnosis Date Noted  . Iron deficiency anemia due to chronic blood loss 07/18/2017  . Essential hypertension 01/20/2017  . Hyperlipidemia 01/20/2017  . Morbid obesity ([place]) 01/20/2017    Past Surgical History:  Procedure Laterality Date  . CESAREAN SECTION  2011    OB History    No data available       Home Medications    Prior to Admission medications   Medication Sig Start Date End Date Taking? Authorizing Provider    amoxicillin-clavulanate (AUGMENTIN) 875-125 MG tablet Take 1 tablet by mouth 2 (two) times daily. Patient may start this on 08/20/2017 if not improved by that time 08/20/17   Frederich Cha, MD  aspirin 81 MG chewable tablet Chew by mouth daily.    [provider]  fexofenadine-pseudoephedrine (ALLEGRA-D ALLERGY & CONGESTION) 180-240 MG 24 hr tablet Take 1 tablet by mouth daily. 08/17/17   Frederich Cha, MD  fluticasone (FLONASE) 50 MCG/ACT nasal spray Place 2 sprays into both nostrils daily. 08/17/17   Frederich Cha, MD  lisinopril (PRINIVIL,ZESTRIL) 20 MG tablet Take 20 mg by mouth daily.    [provider]  norethindrone (AYGESTIN) 5 MG tablet Take 5 mg by mouth 2 (two) times daily.    Delsa Bern, MD  pravastatin (PRAVACHOL) 20 MG tablet Take 20 mg by mouth daily.    [provider]  predniSONE (STERAPRED UNI-PAK 21 TAB) 10 MG (21) TBPK tablet Sig 6 tablet day 1, 5 tablets day 2, 4 tablets day 3,,3tablets day 4, 2 tablets day 5, 1 tablet day 6 take all tablets orally 08/17/17   Frederich Cha, MD    Family History History reviewed. No pertinent family history.  Social History Social History  Substance Use Topics  . Smoking status: Never Smoker  . Smokeless tobacco: Never Used  . Alcohol use No     Allergies   Patient has no known allergies.   Review of Systems Review of Systems  HENT: Positive for congestion, ear pain, facial swelling, sinus pain, sinus pressure and sneezing.  Respiratory: Positive for cough. Negative for shortness of breath.   Cardiovascular: Negative for chest pain.  Gastrointestinal: Negative for abdominal pain.  Neurological: Negative for headaches.  Hematological: Positive for adenopathy.  All other systems reviewed and are negative.    Physical Exam Triage Vital Signs ED Triage Vitals  Enc Vitals Group     BP 08/17/17 1221 (!) 141/85     Pulse Rate 08/17/17 1221 93     Resp 08/17/17 1221 16     Temp 08/17/17 1221 97.8 F  (36.6 C)     Temp Source 08/17/17 1221 Oral     SpO2 08/17/17 1221 100 %     Weight 08/17/17 1218 290 lb (131.5 kg)     Height 08/17/17 1218 5\' 5"  (1.651 m)     Head Circumference --      Peak Flow --      Pain Score 08/17/17 1218 3     Pain Loc --      Pain Edu? --      Excl. in [place]? --    No data found.   Updated Vital Signs BP (!) 141/85 (BP Location: Right Arm)   Pulse 93   Temp 97.8 F (36.6 C) (Oral)   Resp 16   Ht 5\' 5"  (1.651 m)   Wt 290 lb (131.5 kg)   SpO2 100%   BMI 48.26 kg/m   Visual Acuity Right Eye Distance:   Left Eye Distance:   Bilateral Distance:    Right Eye Near:   Left Eye Near:    Bilateral Near:     Physical Exam  Constitutional: She is oriented to person, place, and time. She appears well-developed and well-nourished.  HENT:  Head: Normocephalic and atraumatic.  Right Ear: Hearing, tympanic membrane, external ear and ear canal normal.  Left Ear: Hearing, tympanic membrane, external ear and ear canal normal.  Nose: Mucosal edema and rhinorrhea present. Right sinus exhibits maxillary sinus tenderness. Right sinus exhibits no frontal sinus tenderness. Left sinus exhibits maxillary sinus tenderness. Left sinus exhibits no frontal sinus tenderness.  Mouth/Throat: Uvula is midline, oropharynx is clear and moist and mucous membranes are normal. No uvula swelling.  Eyes: Pupils are equal, round, and reactive to light. Conjunctivae are normal.  Neck: Normal range of motion. Neck supple.  Cardiovascular: Normal rate and regular rhythm.   Pulmonary/Chest: Effort normal and breath sounds normal.  Musculoskeletal: Normal range of motion.  Lymphadenopathy:    She has cervical adenopathy.  Neurological: She is alert and oriented to person, place, and time.  Skin: Skin is warm and dry.  Psychiatric: She has a normal mood and affect.  Vitals reviewed.    UC Treatments / Results  Labs (all labs ordered are listed, but only abnormal results are  displayed) Labs Reviewed - No data to display  EKG  EKG Interpretation None       Radiology No results found.  Procedures Procedures (including critical care time)  Medications Ordered in UC Medications - No data to display   Initial Impression / Assessment and Plan / UC Course  I have reviewed the triage vital signs and the nursing notes.  Pertinent labs & imaging results that were available during my care of the patient were reviewed by me and considered in my medical decision making (see chart for details).     We'll place patient on Allegra-D, prednisone six-day course Flonase nasal spray 2 puffs each nostril daily if not better by Sunday give her a  prescription of Augmentin that she felt that time and take that for 10 days. We'll hold off at least until Sunday and tried prednisone and Allegra-D 60  Final Clinical Impressions(s) / UC Diagnoses   Final diagnoses:  Seasonal allergic rhinitis due to pollen    New Prescriptions New Prescriptions   AMOXICILLIN-CLAVULANATE (AUGMENTIN) 875-125 MG TABLET    Take 1 tablet by mouth 2 (two) times daily. Patient may start this on 08/20/2017 if not improved by that time   FEXOFENADINE-PSEUDOEPHEDRINE (ALLEGRA-D ALLERGY & CONGESTION) 180-240 MG 24 HR TABLET    Take 1 tablet by mouth daily.   FLUTICASONE (FLONASE) 50 MCG/ACT NASAL SPRAY    Place 2 sprays into both nostrils daily.   PREDNISONE (STERAPRED UNI-PAK 21 TAB) 10 MG (21) TBPK TABLET    Sig 6 tablet day 1, 5 tablets day 2, 4 tablets day 3,,3tablets day 4, 2 tablets day 5, 1 tablet day 6 take all tablets orally   Note: This dictation was prepared with Dragon dictation along with smaller phrase technology. Any transcriptional errors that result from this process are unintentional. Controlled Substance Prescriptions [place] Controlled Substance Registry consulted? Not Applicable   Frederich Cha, MD 08/17/17 1315

## 2017-09-08 ENCOUNTER — Ambulatory Visit
Admission: EM | Admit: 2017-09-08 | Discharge: 2017-09-08 | Disposition: A | Discharge status: Home / Self Care | Payer: BLUE CROSS/BLUE SHIELD | Attending: Family Medicine | Admitting: Family Medicine

## 2017-09-08 ENCOUNTER — Encounter: Payer: Self-pay | Admitting: Emergency Medicine

## 2017-09-08 DIAGNOSIS — S50861A Insect bite (nonvenomous) of right forearm, initial encounter: Secondary | ICD-10-CM | POA: Diagnosis not present

## 2017-09-08 DIAGNOSIS — S0086XA Insect bite (nonvenomous) of other part of head, initial encounter: Secondary | ICD-10-CM | POA: Diagnosis not present

## 2017-09-08 DIAGNOSIS — W57XXXA Bitten or stung by nonvenomous insect and other nonvenomous arthropods, initial encounter: Secondary | ICD-10-CM

## 2017-09-08 MED ORDER — SULFAMETHOXAZOLE-TRIMETHOPRIM 800-160 MG PO TABS: 1.0000 | ORAL_TABLET | Freq: Two times a day (BID) | ORAL | 0 refills | Status: DC

## 2017-09-08 MED ORDER — MUPIROCIN 2 % EX OINT: 1.0000 "application " | TOPICAL_OINTMENT | Freq: Three times a day (TID) | CUTANEOUS | 0 refills | Status: DC

## 2017-09-08 NOTE — ED Provider Notes (Signed)
[facility]    CSN: [id number] Arrival date & time: 09/08/17  1129     History   Chief Complaint Chief Complaint  Patient presents with  . Insect Bite    HPI Cathy Bryant is a 46 y.o. female.   HPI  This a 46 year old female who presents with a insect bite on her right forearm dorsum and also over her right temple. The patient states that she just returned from a business trip to [facility] [place] and was in a less than clean hotel room. She states that she awoke with an itching sensation on her arm and her face. She immediately searched the room for bedbugs or any other insect none of which she found. She is concerned because she had a friend who had a devastating infection from a bug bite do not want a lot of progress.       History reviewed. No pertinent past medical history.  Patient Active Problem List   Diagnosis Date Noted  . Iron deficiency anemia due to chronic blood loss 07/18/2017  . Essential hypertension 01/20/2017  . Hyperlipidemia 01/20/2017  . Morbid obesity ([place]) 01/20/2017    Past Surgical History:  Procedure Laterality Date  . CESAREAN SECTION  2011    OB History    No data available       Home Medications    Prior to Admission medications   Medication Sig Start Date End Date Taking? Authorizing Provider  aspirin 81 MG chewable tablet Chew by mouth daily.    [provider]  fexofenadine-pseudoephedrine (ALLEGRA-D ALLERGY & CONGESTION) 180-240 MG 24 hr tablet Take 1 tablet by mouth daily. 08/17/17   Frederich Cha, MD  fluticasone (FLONASE) 50 MCG/ACT nasal spray Place 2 sprays into both nostrils daily. 08/17/17   Frederich Cha, MD  lisinopril (PRINIVIL,ZESTRIL) 20 MG tablet Take 20 mg by mouth daily.    [provider]  mupirocin ointment (BACTROBAN) 2 % Apply 1 application topically 3 (three) times daily. 09/08/17   Lorin Picket, PA-C  norethindrone (AYGESTIN) 5 MG tablet Take 5 mg by mouth 2 (two)  times daily.    Delsa Bern, MD  pravastatin (PRAVACHOL) 20 MG tablet Take 20 mg by mouth daily.    [provider]  sulfamethoxazole-trimethoprim (BACTRIM DS,SEPTRA DS) 800-160 MG tablet Take 1 tablet by mouth 2 (two) times daily. 09/08/17   Lorin Picket, PA-C    Family History History reviewed. No pertinent family history.  Social History Social History  Substance Use Topics  . Smoking status: Never Smoker  . Smokeless tobacco: Never Used  . Alcohol use No     Allergies   Patient has no known allergies.   Review of Systems Review of Systems  Constitutional: Negative for activity change, chills, fatigue and fever.  Skin: Positive for wound.  All other systems reviewed and are negative.    Physical Exam Triage Vital Signs ED Triage Vitals  Enc Vitals Group     BP 09/08/17 1139 122/88     Pulse Rate 09/08/17 1139 87     Resp 09/08/17 1139 16     Temp 09/08/17 1139 98.4 F (36.9 C)     Temp Source 09/08/17 1139 Oral     SpO2 09/08/17 1139 100 %     Weight 09/08/17 1137 295 lb (133.8 kg)     Height 09/08/17 1137 5\' 5"  (1.651 m)     Head Circumference --      Peak Flow --  Pain Score 09/08/17 1137 0     Pain Loc --      Pain Edu? --      Excl. in [place]? --    No data found.   Updated Vital Signs BP 122/88 (BP Location: Left Arm)   Pulse 87   Temp 98.4 F (36.9 C) (Oral)   Resp 16   Ht 5\' 5"  (1.651 m)   Wt 295 lb (133.8 kg)   SpO2 100%   BMI 49.09 kg/m   Visual Acuity Right Eye Distance:   Left Eye Distance:   Bilateral Distance:    Right Eye Near:   Left Eye Near:    Bilateral Near:     Physical Exam  Constitutional: She is oriented to person, place, and time. She appears well-developed and well-nourished. No distress.  HENT:  Head: Normocephalic.  Eyes: Pupils are equal, round, and reactive to light.  Neck: Normal range of motion.  Musculoskeletal: Normal range of motion.  Neurological: She is alert and oriented to person,  place, and time.  Skin: Skin is warm and dry. She is not diaphoretic.  She has a solitary puncture type wound with surrounding erythema and induration extending out approximately 1 cm in diameter. Is no drainage present. He also has a similar puncture type wound over her temporal area on the right. Again noted drainage is present. This is much smaller than the one on her forearm.  Psychiatric: She has a normal mood and affect. Her behavior is normal. Judgment and thought content normal.  Nursing note and vitals reviewed.    UC Treatments / Results  Labs (all labs ordered are listed, but only abnormal results are displayed) Labs Reviewed - No data to display  EKG  EKG Interpretation None       Radiology No results found.  Procedures Procedures (including critical care time)  Medications Ordered in UC Medications - No data to display   Initial Impression / Assessment and Plan / UC Course  I have reviewed the triage vital signs and the nursing notes.  Pertinent labs & imaging results that were available during my care of the patient were reviewed by me and considered in my medical decision making (see chart for details).     Plan: 1. Test/x-ray results and diagnosis reviewed with patient 2. rx as per orders; risks, benefits, potential side effects reviewed with patient 3. Recommend supportive treatment with applying Bactroban to both areas 3 times a day. Finished a short course of oral antibiotics. If the wounds worsen return to our clinic for further evaluation and change in therapy. 4. F/u prn if symptoms worsen or don't improve   Final Clinical Impressions(s) / UC Diagnoses   Final diagnoses:  Insect bite, initial encounter    New Prescriptions Discharge Medication List as of 09/08/2017 12:39 PM    START taking these medications   Details  mupirocin ointment (BACTROBAN) 2 % Apply 1 application topically 3 (three) times daily., Starting Fri 09/08/2017, Normal          Controlled Substance Prescriptions [place] Controlled Substance Registry consulted? Not Applicable   Lorin Picket, PA-C 09/08/17 2055

## 2017-09-08 NOTE — ED Triage Notes (Signed)
Patient was in her hotel room on Tuesday night and woke up with an insect bit to her right lower arm and on the right side of her head.  Patient reports some itching.

## 2017-09-15 ENCOUNTER — Telehealth: Payer: Self-pay

## 2017-09-15 ENCOUNTER — Ambulatory Visit: Payer: BLUE CROSS/BLUE SHIELD

## 2017-09-15 ENCOUNTER — Other Ambulatory Visit (HOSPITAL_BASED_OUTPATIENT_CLINIC_OR_DEPARTMENT_OTHER): Payer: BLUE CROSS/BLUE SHIELD

## 2017-09-15 ENCOUNTER — Ambulatory Visit (HOSPITAL_BASED_OUTPATIENT_CLINIC_OR_DEPARTMENT_OTHER): Payer: BLUE CROSS/BLUE SHIELD | Admitting: Hematology

## 2017-09-15 VITALS — BP 138/81 | HR 99 | Temp 98.7°F | Resp 17 | Ht 65.0 in | Wt 289.8 lb

## 2017-09-15 DIAGNOSIS — N920 Excessive and frequent menstruation with regular cycle: Secondary | ICD-10-CM

## 2017-09-15 DIAGNOSIS — R945 Abnormal results of liver function studies: Secondary | ICD-10-CM

## 2017-09-15 DIAGNOSIS — I82502 Chronic embolism and thrombosis of unspecified deep veins of left lower extremity: Secondary | ICD-10-CM

## 2017-09-15 DIAGNOSIS — D500 Iron deficiency anemia secondary to blood loss (chronic): Secondary | ICD-10-CM

## 2017-09-15 DIAGNOSIS — D6859 Other primary thrombophilia: Secondary | ICD-10-CM

## 2017-09-15 DIAGNOSIS — R7989 Other specified abnormal findings of blood chemistry: Secondary | ICD-10-CM

## 2017-09-15 LAB — CBC & DIFF AND RETIC
BASO%: 0.4 % (ref 0.0–2.0)
Basophils Absolute: 0 10*3/uL (ref 0.0–0.1)
EOS%: 2 % (ref 0.0–7.0)
Eosinophils Absolute: 0.1 10*3/uL (ref 0.0–0.5)
HCT: 42.2 % (ref 34.8–46.6)
HEMOGLOBIN: 13.7 g/dL (ref 11.6–15.9)
IMMATURE RETIC FRACT: 2.8 % (ref 1.60–10.00)
LYMPH%: 38.1 % (ref 14.0–49.7)
MCH: 27.6 pg (ref 25.1–34.0)
MCHC: 32.5 g/dL (ref 31.5–36.0)
MCV: 84.9 fL (ref 79.5–101.0)
MONO#: 0.1 10*3/uL (ref 0.1–0.9)
MONO%: 2.6 % (ref 0.0–14.0)
NEUT#: 2.9 10*3/uL (ref 1.5–6.5)
NEUT%: 56.9 % (ref 38.4–76.8)
PLATELETS: 320 10*3/uL (ref 145–400)
RBC: 4.97 10*6/uL (ref 3.70–5.45)
RDW: 21 % — ABNORMAL HIGH (ref 11.2–14.5)
Retic %: 1.43 % (ref 0.70–2.10)
Retic Ct Abs: 71.07 10*3/uL (ref 33.70–90.70)
WBC: 5 10*3/uL (ref 3.9–10.3)
lymph#: 1.9 10*3/uL (ref 0.9–3.3)

## 2017-09-15 LAB — COMPREHENSIVE METABOLIC PANEL
ALBUMIN: 3.5 g/dL (ref 3.5–5.0)
ALK PHOS: 48 U/L (ref 40–150)
ALT: 127 U/L — ABNORMAL HIGH (ref 0–55)
ANION GAP: 9 meq/L (ref 3–11)
AST: 66 U/L — ABNORMAL HIGH (ref 5–34)
BILIRUBIN TOTAL: 0.55 mg/dL (ref 0.20–1.20)
BUN: 10.4 mg/dL (ref 7.0–26.0)
CALCIUM: 8.9 mg/dL (ref 8.4–10.4)
CO2: 27 mEq/L (ref 22–29)
Chloride: 104 mEq/L (ref 98–109)
Creatinine: 0.9 mg/dL (ref 0.6–1.1)
EGFR: 83 mL/min/{1.73_m2} — AB (ref >90)
Glucose: 112 mg/dl (ref 70–140)
Potassium: 3 mEq/L — CL (ref 3.5–5.1)
Sodium: 140 mEq/L (ref 136–145)
TOTAL PROTEIN: 7.1 g/dL (ref 6.4–8.3)

## 2017-09-15 LAB — IRON AND TIBC
%SAT: 22 % (ref 21–57)
IRON: 66 ug/dL (ref 41–142)
TIBC: 298 ug/dL (ref 236–444)
UIBC: 232 ug/dL (ref 120–384)

## 2017-09-15 LAB — FERRITIN: Ferritin: 373 ng/ml — ABNORMAL HIGH (ref 9–269)

## 2017-09-15 MED ORDER — POTASSIUM CHLORIDE ER 20 MEQ PO TBCR: 20.0000 meq | EXTENDED_RELEASE_TABLET | Freq: Every day | ORAL | 0 refills | Status: DC

## 2017-09-15 NOTE — Progress Notes (Signed)
Marland Kitchen    HEMATOLOGY/ONCOLOGY CLINIC NOTE  Date of Service: 09/15/17  Patient Care Team: Patient, No Pcp Per as PCP - General (General Practice)  CHIEF COMPLAINTS/PURPOSE OF CONSULTATION:  H/o DVT , pre-operative evaluation  HISTORY OF PRESENTING ILLNESS:  Cathy Bryant is a wonderful 46 y.o. female who has been referred to [place] by Dr Delsa Bern MD for evaluation and management of h/o DVT - pre-operative evaluation - planning hysterectomy for symptomatic fibroids.  Patient is a poor historian report history of chronic anemia with a hemoglobin of 9.2 on 04/28/2017 thought to be likely related to symptomatic uterine fibroids. She notes that she has had menorrhagia and is on norethindrone for the last 2 months. She also reports a history of hypertension and previous history of DVT.   Patient reports that she has previously had a DVT in her left calf diagnosed in December 2013. Patient reports that she had a primi child delivered in November 2011 at 28 weeks after complicated pregnancy. She notes that she presented with left calf swelling in December 2012 at an outside hospital and reports an ultrasound at the time did not diagnosed DVT. 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 She notes that she was put on aspirin and has not been treated with therapeutic anticoagulation for her DVT.  She does not recollect if she was on oral contraceptive pills around the time of her DVT. She reports that she had broken an ankle from a fall.  Patient reports no family history of venous thromboembolism. Patient denies being a smoker at this time. She is currently on a baby aspirin  INTERVAL HISTORY  Patient is here for f/u regarding a previous history of DVT and iron deficiency anemia.  Overall she is doing well. She reports that she did not have any  issues tolerating IV Iron. Anemia has resolved from 10.4 to 13.7 with IV Iron. She has previously been on PO iron replacement. She has been on Norethindrone since May, is scheduled to have an ablation next Friday. If this controls her heavy menses, we may not have to leave her on oral iron. If not, we will opt for iron polysaccharide to reduce her need for IV iron. We will hold of on oral iron until she completes her procedure. She denies any leg swelling, leg cramping, claudication.   Potassium is low at 3.0, she is currently on diuretic however. Informed her that we can place her on a Potassium replacement and have her PCP monitor this.   MEDICAL HISTORY:   #1 hypertension #2 uterine fibroids with menorrhagia and blood loss anemia and dysfunctional uterine bleeding #3 history of left calf DVT in December 2013 - diagnosed late. No treated with anticoagulation. #4 morbid obesity .Body mass index is 48.23 kg/m. #5 history of broken ankle in 2010 from a fall #6 history of preterm delivery at 28 weeks in November 2011   SURGICAL HISTORY: Past Surgical History:  Procedure Laterality Date  . CESAREAN SECTION  2011    SOCIAL HISTORY: Social History   Social History  . Marital status: Single    Spouse name: N/A  . Number of children: N/A  . Years of education: N/A   Occupational History  . Not on file.   Social History Main Topics  . Smoking status: Never Smoker  . Smokeless  tobacco: Never Used  . Alcohol use No  . Drug use: No  . Sexual activity: No   Other Topics Concern  . Not on file   Social History Narrative  . No narrative on file  Patient notes active smoking or significant alcohol use.   FAMILY HISTORY: No family history on file.  ALLERGIES:  has No Known Allergies.  MEDICATIONS:  Current Outpatient Prescriptions  Medication Sig Dispense Refill  . aspirin 81 MG chewable tablet Chew by mouth daily.    . fexofenadine-pseudoephedrine (ALLEGRA-D ALLERGY &  CONGESTION) 180-240 MG 24 hr tablet Take 1 tablet by mouth daily. 30 tablet 0  . fluticasone (FLONASE) 50 MCG/ACT nasal spray Place 2 sprays into both nostrils daily. 16 g 0  . lisinopril (PRINIVIL,ZESTRIL) 20 MG tablet Take 20 mg by mouth daily.    . mupirocin ointment (BACTROBAN) 2 % Apply 1 application topically 3 (three) times daily. 22 g 0  . norethindrone (AYGESTIN) 5 MG tablet Take 5 mg by mouth 2 (two) times daily.    . pravastatin (PRAVACHOL) 20 MG tablet Take 20 mg by mouth daily.    Marland Kitchen sulfamethoxazole-trimethoprim (BACTRIM DS,SEPTRA DS) 800-160 MG tablet Take 1 tablet by mouth 2 (two) times daily. 10 tablet 0   No current facility-administered medications for this visit.     REVIEW OF SYSTEMS:    10 Point review of Systems was done is negative except as noted above.  PHYSICAL EXAMINATION: ECOG PERFORMANCE STATUS: 1 - Symptomatic but completely ambulatory  . Vitals:   09/15/17 1029  BP: 138/81  Pulse: 99  Resp: 17  Temp: 98.7 F (37.1 C)  SpO2: 100%   Filed Weights   09/15/17 1029  Weight: 289 lb 12.8 oz (131.5 kg)   .Body mass index is 48.23 kg/m.  GENERAL:alert, in no acute distress and comfortable SKIN: no acute rashes, no significant lesions EYES: conjunctiva are pink and non-injected, sclera anicteric OROPHARYNX: MMM, no exudates, no oropharyngeal erythema or ulceration NECK: supple, no JVD LYMPH:  no palpable lymphadenopathy in the cervical, axillary or inguinal regions LUNGS: clear to auscultation b/l with normal respiratory effort HEART: regular rate & rhythm ABDOMEN:  normoactive bowel sounds , non tender, not distended. Extremity: no pedal edema no overt calf pain and swelling or redness of this time PSYCH: alert & oriented x 3 with fluent speech NEURO: no focal motor/sensory deficits  LABORATORY DATA:  I have reviewed the data as listed  . CBC Latest Ref Rng & Units 09/15/2017 07/21/2017 06/30/2017  WBC 3.9 - 10.3 10e3/uL 5.0 5.1 5.0  Hemoglobin  11.6 - 15.9 g/dL 13.7 11.0(L) 10.4(L)  Hematocrit 34.8 - 46.6 % 42.2 34.9 33.2(L)  Platelets 145 - 400 10e3/uL 320 396 432(H)   . CBC    Component Value Date/Time   WBC 5.0 09/15/2017 0948   RBC 4.97 09/15/2017 0948   HGB 13.7 09/15/2017 0948   HCT 42.2 09/15/2017 0948   PLT 320 09/15/2017 0948   MCV 84.9 09/15/2017 0948   MCH 27.6 09/15/2017 0948   MCHC 32.5 09/15/2017 0948   RDW 21.0 (H) 09/15/2017 0948   LYMPHSABS 1.9 09/15/2017 0948   MONOABS 0.1 09/15/2017 0948   EOSABS 0.1 09/15/2017 0948   BASOSABS 0.0 09/15/2017 0948    . CMP Latest Ref Rng & Units 09/15/2017 07/21/2017 06/30/2017  Glucose 70 - 140 mg/dl 112 86 106  BUN 7.0 - 26.0 mg/dL 10.4 11.3 10.5  Creatinine 0.6 - 1.1 mg/dL 0.9 0.9 0.9  Sodium 136 -  145 mEq/L 140 140 139  Potassium 3.5 - 5.1 mEq/L 3.0(LL) 3.6 3.7  CO2 22 - 29 mEq/L 27 25 23   Calcium 8.4 - 10.4 mg/dL 8.9 8.7 8.8  Total Protein 6.4 - 8.3 g/dL 7.1 6.9 7.0  Total Bilirubin 0.20 - 1.20 mg/dL 0.55 0.61 0.30  Alkaline Phos 40 - 150 U/L 48 40 39(L)  AST 5 - 34 U/L 66(H) 49(H) 80(H)  ALT 0 - 55 U/L 127(H) 78(H) 148(H)         Component     Latest Ref Rng & Units 06/30/2017  Anticardiolipin Ab,IgG,Qn     0 - 14 GPL U/mL <9  Anticardiolipin Ab,IgM,Qn     0 - 12 MPL U/mL <9  Anticardiolipin Ab,IgA,Qn     0 - 11 APL U/mL <9  Beta-2 Glycoprotein I Ab, IgG     0 - 20 GPI IgG units <9  Beta-2 Glyco 1 IgA     0 - 25 GPI IgA units <9  Beta-2 Glyco 1 IgM     0 - 32 GPI IgM units <9    RADIOGRAPHIC STUDIES: I have personally reviewed the radiological images as listed and agreed with the findings in the report.  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 - No evidence of superficial thrombosis involving the left lower   extremity. - No evidence of Baker&'s cyst on the left.  Other  specific details can be found in the table(s) above. 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 This will apparently diagnosed late and was noted to be chronic at the time of diagnosis the patient had symptoms for about a year prior to this. Has not been treated with anticoagulation for this. Unclear provoking factors. -We evaluated for factor V Leiden, prothrombin gene mutations and antiphospholipid antibodies - workup was unrevealing. 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  Plan -No indication for therapeutic anticoagulation at this time. -No obvious inherited or pathologic acquired thrombophilia noted. Results were discussed in details with the patient. -Reasonable to continue baby aspirin. -Patient would be okay to proceed with planned hysterectomy with appropriate perioperative DVT prophylaxis with Lovenox, from a thrombophilia standpoint. -counseled on need for diet/exercise and weight loss -Anemia has improved with Hgb at 13.7 TODAY, she has responded to IV Iron well. -She is scheduled to have ablation next week with her OBGYN; hopefully this helps to control her anemia. We will hold off on iron replacement right now and monitor her counts following this procedure given her controlled Hgb at this time.  -Potassium is somewhat low; this may be a result of her diuretic. I informed her that she should discuss this with her PCP for ongoing management. We discussed that we will place her on potassium supplement in the  interim. She will take 43meq a day of three days and then 41meq a day for 2 weekl weeks following. She should follow up with her PCP for ongoing  management of this as well.   #2 Iron deficiency anemia related to menorrhagia from dysfunctional uterine bleeding related to uterine fibroids. . Lab Results  Component Value Date   IRON 44 07/21/2017   TIBC 426 07/21/2017   IRONPCTSAT 10 (L) 07/21/2017   (Iron and TIBC)  Lab Results  Component Value Date   FERRITIN 7 (L) 07/21/2017   . Lab Results  Component Value Date   IRON 66 09/15/2017   TIBC 298 09/15/2017   IRONPCTSAT 22 09/15/2017   (Iron and TIBC)  Lab Results  Component Value Date   FERRITIN 373 (H) 09/15/2017    # 3 Thrombocytosis - likely reactive From menorrhagia and from iron deficiency. Resolved with Iron replacement.  PLAN: -Iron deficiency resolved with IV iron. Ferritin adequate. No indication for additional IV Iron at this time.  #4- Abnormal LFTs -AST at 66 and ALT at 127  PLAN: -AST and ALT have increased somewhat, I have informed her this may be due to Norethindrone or Pravastatin and that she should inform her PCP of this so that they may work this up further. I also discussed with her that this may be a result of possible presence of fatty liver or other liver etiology, and that her PCP would be the best source for ongoing management of this.   RTC with Dr Irene Limbo in 73mo with labs  All of the patients questions were answered with apparent satisfaction. The patient knows to call the clinic with any problems, questions or concerns.  I spent 20  minutes counseling the patient face to face. The total time spent in the appointment was 25 minutes and more than 50% was on counseling and direct patient cares.    Sullivan Lone MD [place] AAHIVMS [facility] Hematology/Oncology Physician [facility]  (Office):       [phone] (Work cell):  [phone] (Fax):           [phone]  09/15/2017 10:54  AM  This document serves as a record of services personally performed by Sullivan Lone, MD. It was created on his behalf by Reola Mosher, a trained medical scribe. The creation of this record is based on the scribe's personal observations and the provider's statements to them. This document has been checked and approved by the attending provider.

## 2017-09-15 NOTE — Patient Instructions (Addendum)
Please call and discuss findings of abnormal liver function tests with your primary care physician and gynecologist. This could be due to fatty liver but also could be due to medications or on including your cholesterol lowering medicine pravastatin or from the norethindrone.  Potassium prescription has been sent to your pharmacy.

## 2017-09-15 NOTE — Telephone Encounter (Signed)
Clearance request for hysteroscopy completed. PCP will have to clear pt for in-office general anesthesia. Fax sent to [facility] at [phone]. Confirmed fax receipt 09/15/17 at 1849.

## 2017-09-15 NOTE — Telephone Encounter (Signed)
New fax to be sent regarding hysteroscopy clearance. Last clearance form was sign of for hysterectomy. Dr. Irene Limbo wanted pt to be transitioned to lovenox. Office wanted to confirm if this change was still needed. Additionally asked when pt was to receive injectafer x2. Pt already completed those treatments in August. No further orders for IV injectafer at this time.

## 2017-09-18 ENCOUNTER — Telehealth: Payer: Self-pay | Admitting: Hematology

## 2017-09-18 ENCOUNTER — Telehealth: Payer: Self-pay | Admitting: *Deleted

## 2017-09-18 NOTE — Telephone Encounter (Signed)
"  Why did Dr. Irene Limbo refuse approval for Anesthesiologist for the Hysteroscopy?  This is a less invasive test.  This has opened a new can of worms.  Return number [phone].  PCP appointment on 18th was rescheduled to tomorrow due to [place].  Saw my PCP in March of this year.  I hear what you're saying but if he had only said authorization by surgeon not PCP this would be easier.  I'm scheduled for surgery Friday but it will be cancelled if I do not receive authorization.  It will take an act of congress for PCP and I've been working with Dr. Irene Limbo since May.  I have the day off, everything was going well and now this."    Routing call information to collaborative nurse and provider for review.  Further patient communication through collaborative nurse.    This nurse shared nursing understanding of anesthesiologist function and role to select best anesthesia to use, monitor and maintain blood pressure, heart rate and rhythm, respiratory status, consciousness to keep patients comfortable, safe and stable through procedures to post-anesthesia care.  Hematology authorization was authorized for Hysteroscopy for any risk of bleeding, clotting factor, iron associated with risk performing surgery/procedure.  Anesthesiologist need information for their role which Hematology has no evaluations or measurements for.  Information on the the quality of B/P, heart, lungs, other body systems factor into anesthesia is why surgeon and anethesiologist need all patient history, authorizations for monitoring and maintenance during procedures for the best outcomes of physical processes and aliveness.  Patient aware this nurse communicating her request for authorization to provider but was also encouraged to contact PCP and Dr, Rivard as well.  Authorization may have been requested from PCP as well.

## 2017-09-18 NOTE — Telephone Encounter (Signed)
Scheduled appt per 9/28 - lab and f/u in 4 months. Reminder letter sent.

## 2017-09-18 NOTE — Telephone Encounter (Signed)
Dr. Grier Mitts patient

## 2017-11-14 ENCOUNTER — Telehealth: Payer: Self-pay | Admitting: Hematology

## 2017-11-14 NOTE — Telephone Encounter (Signed)
Faxed records to [facility] health

## 2017-11-30 ENCOUNTER — Other Ambulatory Visit: Payer: Self-pay

## 2017-11-30 ENCOUNTER — Ambulatory Visit
Admission: EM | Admit: 2017-11-30 | Discharge: 2017-11-30 | Disposition: A | Discharge status: Home / Self Care | Payer: BLUE CROSS/BLUE SHIELD | Attending: Family Medicine | Admitting: Family Medicine

## 2017-11-30 DIAGNOSIS — R42 Dizziness and giddiness: Secondary | ICD-10-CM

## 2017-11-30 DIAGNOSIS — J019 Acute sinusitis, unspecified: Secondary | ICD-10-CM

## 2017-11-30 HISTORY — DX: Essential (primary) hypertension: I10

## 2017-11-30 MED ORDER — AMOXICILLIN-POT CLAVULANATE 875-125 MG PO TABS: 1.0000 | ORAL_TABLET | Freq: Two times a day (BID) | ORAL | 0 refills | Status: DC

## 2017-11-30 NOTE — ED Triage Notes (Signed)
Pt with "sinus pressure", ears stopped up, facial pain and blowing yellow tinged with blood from nose. Pain 2/10

## 2017-11-30 NOTE — ED Provider Notes (Signed)
[facility]    CSN: [id number] Arrival date & time: 11/30/17  1043  History   Chief Complaint Chief Complaint  Patient presents with  . Sinus Problem   HPI  46 year old female presents with concerns for sinusitis.  Patient states that she is been sick since Sunday.  Patient states that she has been experiencing significant sinus pressure and congestion.  She is been having discolored sputum and nasal discharge.  She also reports headache and dizziness.  No fever.  No known exacerbating relieving factors.  No reports of any medications tried.  Symptoms are severe.  No other associated symptoms.  No other complaints at this time.  Past Medical History:  Diagnosis Date  . Hypertension    Patient Active Problem List   Diagnosis Date Noted  . Iron deficiency anemia due to chronic blood loss 07/18/2017  . Essential hypertension 01/20/2017  . Hyperlipidemia 01/20/2017  . Morbid obesity ([place]) 01/20/2017   Past Surgical History:  Procedure Laterality Date  . CESAREAN SECTION  2011   OB History    No data available     Home Medications    Prior to Admission medications   Medication Sig Start Date End Date Taking? Authorizing Provider  amoxicillin-clavulanate (AUGMENTIN) 875-125 MG tablet Take 1 tablet by mouth every 12 (twelve) hours. 11/30/17   Coral Spikes, DO  aspirin 81 MG chewable tablet Chew by mouth daily.    [provider]  fexofenadine-pseudoephedrine (ALLEGRA-D ALLERGY & CONGESTION) 180-240 MG 24 hr tablet Take 1 tablet by mouth daily. 08/17/17   Frederich Cha, MD  fluticasone (FLONASE) 50 MCG/ACT nasal spray Place 2 sprays into both nostrils daily. 08/17/17   Frederich Cha, MD  lisinopril (PRINIVIL,ZESTRIL) 20 MG tablet Take 20 mg by mouth daily.    [provider]    Family History History reviewed. No pertinent family history.  Social History Social History   Tobacco Use  . Smoking status: Never Smoker  . Smokeless tobacco: Never  Used  Substance Use Topics  . Alcohol use: No  . Drug use: No     Allergies   Patient has no known allergies.   Review of Systems Review of Systems  Constitutional: Negative for fever.  HENT: Positive for congestion, sinus pressure, sinus pain and sore throat.   Respiratory: Positive for cough.   Neurological: Positive for headaches.    Physical Exam Triage Vital Signs ED Triage Vitals  Enc Vitals Group     BP 11/30/17 1118 109/72     Pulse Rate 11/30/17 1118 73     Resp 11/30/17 1118 18     Temp 11/30/17 1118 98.6 F (37 C)     Temp Source 11/30/17 1118 Oral     SpO2 11/30/17 1118 100 %     Weight 11/30/17 1118 290 lb (131.5 kg)     Height 11/30/17 1118 5\' 5"  (1.651 m)     Head Circumference --      Peak Flow --      Pain Score 11/30/17 1120 2     Pain Loc --      Pain Edu? --      Excl. in [place]? --    Updated Vital Signs BP 109/72 (BP Location: Right Arm)   Pulse 73   Temp 98.6 F (37 C) (Oral)   Resp 18   Ht 5\' 5"  (1.651 m)   Wt 290 lb (131.5 kg)   LMP 11/20/2017   SpO2 100%   BMI 48.26  kg/m   Physical Exam  Constitutional: She is oriented to person, place, and time. She appears well-developed. No distress.  HENT:  Head: Normocephalic and atraumatic.  Mouth/Throat: Oropharynx is clear and moist.  Normal TMs bilaterally.  Mild maxillary sinus tenderness to palpation.  Eyes: Conjunctivae are normal.  Cardiovascular: Normal rate and regular rhythm.  No murmur heard. Pulmonary/Chest: Effort normal and breath sounds normal. She has no wheezes. She has no rales.  Neurological: She is alert and oriented to person, place, and time.  Skin: Skin is warm. No rash noted.  Psychiatric: She has a normal mood and affect. Her behavior is normal.  Vitals reviewed.  UC Treatments / Results  Labs (all labs ordered are listed, but only abnormal results are displayed) Labs Reviewed - No data to display  EKG  EKG Interpretation None       Radiology No  results found.  Procedures Procedures (including critical care time)  Medications Ordered in UC Medications - No data to display   Initial Impression / Assessment and Plan / UC Course  I have reviewed the triage vital signs and the nursing notes.  Pertinent labs & imaging results that were available during my care of the patient were reviewed by me and considered in my medical decision making (see chart for details).     46 year old female presents with sinusitis. Treating with Augmentin.  Final Clinical Impressions(s) / UC Diagnoses   Final diagnoses:  Acute non-recurrent sinusitis, unspecified location    ED Discharge Orders        Ordered    amoxicillin-clavulanate (AUGMENTIN) 875-125 MG tablet  Every 12 hours     11/30/17 1147     Controlled Substance Prescriptions [place] Controlled Substance Registry consulted? Not Applicable   Coral Spikes, DO 11/30/17 1202

## 2017-11-30 NOTE — Discharge Instructions (Signed)
Antibiotic as prescribed.  Hope you feel better soon.  Dr. Lacinda Axon

## 2017-12-04 ENCOUNTER — Telehealth: Payer: Self-pay | Admitting: Emergency Medicine

## 2017-12-04 NOTE — Telephone Encounter (Signed)
Patient seen here 4 days ago for sinus infection called today stating her ears are still very stopped up.  Patient asked if there was anything else she could take.  Allegra D or Claritin D as well as OTC Flonase was recommended.  Patient verbalized understanding and states she will get those today to see if they help.  nmw

## 2018-01-04 ENCOUNTER — Other Ambulatory Visit: Payer: Self-pay | Admitting: Family Medicine

## 2018-01-08 ENCOUNTER — Encounter (HOSPITAL_COMMUNITY): Payer: Self-pay | Admitting: Emergency Medicine

## 2018-01-08 ENCOUNTER — Ambulatory Visit (HOSPITAL_COMMUNITY)
Admission: EM | Admit: 2018-01-08 | Discharge: 2018-01-08 | Disposition: A | Discharge status: Home / Self Care | Payer: BLUE CROSS/BLUE SHIELD | Attending: Family Medicine | Admitting: Family Medicine

## 2018-01-08 ENCOUNTER — Other Ambulatory Visit: Payer: Self-pay

## 2018-01-08 DIAGNOSIS — B9789 Other viral agents as the cause of diseases classified elsewhere: Secondary | ICD-10-CM | POA: Diagnosis not present

## 2018-01-08 DIAGNOSIS — J069 Acute upper respiratory infection, unspecified: Secondary | ICD-10-CM | POA: Diagnosis not present

## 2018-01-08 MED ORDER — HYDROCODONE-HOMATROPINE 5-1.5 MG/5ML PO SYRP: 5.0000 mL | ORAL_SOLUTION | Freq: Four times a day (QID) | ORAL | 0 refills | Status: DC | PRN

## 2018-01-08 NOTE — ED Triage Notes (Signed)
Pt states she has been suffering from nasal congestion, cough, sore throat, right ear pain, fatigue and dizziness since last Thursday.

## 2018-01-08 NOTE — ED Provider Notes (Signed)
  [place]   [id number] 01/08/18 Arrival Time: 5188  ASSESSMENT & PLAN:  1. Viral URI with cough     Meds ordered this encounter  Medications  . HYDROcodone-homatropine (HYCODAN) 5-1.5 MG/5ML syrup    Sig: Take 5 mLs by mouth every 6 (six) hours as needed for cough.    Dispense:  60 mL    Refill:  0   Work note given if she needs. Cough medication sedation precautions. Discussed typical duration of symptoms. OTC symptom care as needed. Ensure adequate fluid intake and rest. May f/u with PCP or here as needed.  Reviewed expectations re: course of current medical issues. Questions answered. Outlined signs and symptoms indicating need for more acute intervention. Patient verbalized understanding. After Visit Summary given.   SUBJECTIVE: History from: patient.  Cathy Bryant is a 47 y.o. female who presents with complaint of nasal congestion, post-nasal drainage, and a persistent dry cough. Mild ST and R ear pressure also reported. Onset abrupt, approximately 4-5 days ago. Overall fatigued with body aches. SOB: none. Wheezing: none. Fever: no. Questions chills. Overall normal PO intake without n/v. Sick contacts: no. OTC treatment: none.  Social History   Tobacco Use  Smoking Status Never Smoker  Smokeless Tobacco Never Used    ROS: As per HPI.   OBJECTIVE:  Vitals:   01/08/18 1042  BP: (!) 134/102  Pulse: 95  Temp: 98.4 F (36.9 C)  TempSrc: Oral  SpO2: 99%     General appearance: alert; appears fatigued HEENT: nasal congestion; clear runny nose; throat irritation secondary to post-nasal drainage Neck: supple without LAD Lungs: unlabored respirations, symmetrical air entry; cough: mild; no respiratory distress Skin: warm and dry Psychological: alert and cooperative; normal mood and affect  No Known Allergies  Past Medical History:  Diagnosis Date  . Hypertension    History reviewed. No pertinent family history. Social History    Socioeconomic History  . Marital status: Single    Spouse name: Not on file  . Number of children: Not on file  . Years of education: Not on file  . Highest education level: Not on file  Social Needs  . Financial resource strain: Not on file  . Food insecurity - worry: Not on file  . Food insecurity - inability: Not on file  . Transportation needs - medical: Not on file  . Transportation needs - non-medical: Not on file  Occupational History  . Not on file  Tobacco Use  . Smoking status: Never Smoker  . Smokeless tobacco: Never Used  Substance and Sexual Activity  . Alcohol use: No  . Drug use: No  . Sexual activity: No  Other Topics Concern  . Not on file  Social History Narrative  . Not on file           Vanessa Kick, MD 01/08/18 [phone]

## 2018-01-12 ENCOUNTER — Telehealth: Payer: Self-pay | Admitting: *Deleted

## 2018-01-12 NOTE — Telephone Encounter (Signed)
"  I received a message that my appointment on Monday has ben rescheduled to Tuesday.  I will not be available Tuesday, need to speak with whoever called me or a scheduler to reschedule."  No phone notes at time of call.  Transferred to scheduling option 2 to speak with scheduler or leave message.

## 2018-01-15 ENCOUNTER — Other Ambulatory Visit: Payer: BLUE CROSS/BLUE SHIELD

## 2018-01-15 ENCOUNTER — Ambulatory Visit: Payer: BLUE CROSS/BLUE SHIELD | Admitting: Hematology

## 2018-01-16 ENCOUNTER — Ambulatory Visit: Payer: BLUE CROSS/BLUE SHIELD | Admitting: Hematology

## 2018-01-16 ENCOUNTER — Other Ambulatory Visit: Payer: BLUE CROSS/BLUE SHIELD

## 2018-01-17 ENCOUNTER — Telehealth: Payer: Self-pay

## 2018-01-17 ENCOUNTER — Other Ambulatory Visit: Payer: Self-pay | Admitting: Obstetrics and Gynecology

## 2018-01-17 NOTE — Telephone Encounter (Signed)
Spoke with Minna Merritts, RN in regards to pt hysterectomy scheduled for 2/14. Seeking recommendation for anticoagulation from the surgeon. Pt history of chronic DVT.  Per Dr. Irene Limbo this is addressed in his last OV note. Minna Merritts was able to locate this information to discuss with surgeon. Pt encouraged to take baby aspirin, but may discontinue five days prior to surgery. Should be scheduled for prophylactic lovenox based on surgeon's protocol.

## 2018-01-18 ENCOUNTER — Encounter (HOSPITAL_COMMUNITY): Payer: Self-pay

## 2018-01-18 ENCOUNTER — Encounter (HOSPITAL_COMMUNITY)
Admission: RE | Admit: 2018-01-18 | Discharge: 2018-01-18 | Disposition: A | Discharge status: Home / Self Care | Payer: BLUE CROSS/BLUE SHIELD | Source: Ambulatory Visit | Attending: Obstetrics and Gynecology | Admitting: Obstetrics and Gynecology

## 2018-01-18 ENCOUNTER — Other Ambulatory Visit: Payer: Self-pay

## 2018-01-18 DIAGNOSIS — Z01812 Encounter for preprocedural laboratory examination: Secondary | ICD-10-CM | POA: Diagnosis present

## 2018-01-18 HISTORY — DX: Other seasonal allergic rhinitis: J30.2

## 2018-01-18 HISTORY — DX: Anemia, unspecified: D64.9

## 2018-01-18 LAB — BASIC METABOLIC PANEL
Anion gap: 6 (ref 5–15)
BUN: 15 mg/dL (ref 6–20)
CALCIUM: 8.8 mg/dL — AB (ref 8.9–10.3)
CO2: 27 mmol/L (ref 22–32)
CREATININE: 0.91 mg/dL (ref 0.44–1.00)
Chloride: 104 mmol/L (ref 101–111)
Glucose, Bld: 97 mg/dL (ref 65–99)
Potassium: 3.8 mmol/L (ref 3.5–5.1)
SODIUM: 137 mmol/L (ref 135–145)

## 2018-01-18 LAB — CBC
HCT: 38.8 % (ref 36.0–46.0)
Hemoglobin: 13.1 g/dL (ref 12.0–15.0)
MCH: 29.4 pg (ref 26.0–34.0)
MCHC: 33.8 g/dL (ref 30.0–36.0)
MCV: 87.2 fL (ref 78.0–100.0)
PLATELETS: 270 10*3/uL (ref 150–400)
RBC: 4.45 MIL/uL (ref 3.87–5.11)
RDW: 13 % (ref 11.5–15.5)
WBC: 5.1 10*3/uL (ref 4.0–10.5)

## 2018-01-18 NOTE — Patient Instructions (Addendum)
Your procedure is scheduled on:  Thursday, Feb 14  Enter through the Main Entrance of [facility] at:  10:30 am  Pick up the phone at the desk and dial [phone].  Call this number if you have problems the morning of surgery: [phone].  Remember: Do NOT eat or Do NOT drink clear liquids (including water) after midnight Wednesday  Take these medicines the morning of surgery with a SIP OF WATER:  Ok to use flonase nasal and claritin if needed.  Stop herbal medications and supplements at this time.  Do NOT wear jewelry (body piercing), metal hair clips/bobby pins, make-up, or nail polish. Do NOT wear lotions, powders, or perfumes.  You may wear deoderant. Do NOT shave for 48 hours prior to surgery. Do NOT bring valuables to the hospital.  Leave suitcase in car.  After surgery it may be brought to your room.  For patients admitted to the hospital, checkout time is 11:00 AM the day of discharge. Have a responsible adult drive you home and stay with you for 24 hours after your procedure.  Home with [facility] cell [phone].

## 2018-01-18 NOTE — H&P (Signed)
Cathy Bryant is a 47 y.o.  female, P: 1-0-0-1 who presents for hysterectomy because of menorrhagia, symptomatic uterine fibroids, dysmenorrhea and anemia.  The patient has a longstanding history of heavy periods that have worsened in the past  3 years  Her flow has lasted up to #2 months with tampon change hourly accompanied by clots.  Over the course of this bleeding the patient was been on norethindrone  10 mg with improvement of her bleeding however, due to elevated liver function tests this course was discontinued.  Her iron has been as low as 9.2  and ferritin, 7.  She received iron infusions that brought her hemoglobin to 13.7 in September 2018.  In 2013 the patient was diagnosed with a chronic DVT in her left lower extremity-one year after giving birth.  She was never placed on any  anti-coagulation therapy and recent evaluation by Hematologist Dr. Irene Limbo showed no evidence of inherited or pathologic acquired thrombophilia.  Dr. Irene Limbo further stated that no  therapeutic anticoagulation therapy is indicated at this time.  Medical clearance for hysterectomy was given during her August 2018 visit with Dr. Irene Limbo with recommendation for Lovenox prophylaxis use at time of surgery.  A pelvic ultrasound in May 2018 showed an anteverted uterus (measuring 10.3 from fundus to external os): 7.35 x 7.22 x 5.48 cm; endometrium: 2.85 cm (thickened and dynamic) with #3 fibroids: right intramural posterior-3.11 cm, left fundal sub-serosal-3.22 cm and right fundal sub-serosal-5.08 cm;  right ovary-1.71 cm and left ovary-2.68 cm.  Given the limited medical treatment options available to the patient she underwent an attempted endometrial ablation procedure in October 2018 however,  the procedure was aborted due to  poor compliance of her  multiple fibroid uterus.   Endometrial Curettings from that procedure did not show any atypia, hyperplasia or malignancy.   Since the D & C,  the patient's periods have,  normalized, lasting 5  days with pad change every 2 hours (primarily for hygiene per patient-not heavy).  Nevertheless, the patient has decided to proceed with definitive therapy in the form of hysterectomy because of the protracted and debilitating nature of her bleeding history.   Past Medical History  OB History: G:1;   P: 1-0-0-1;  C-section 2011  GYN History: menarche: 47 YO    LMP: 12/27/2017    Contraception: abstinence.  Denies history of STDs or  abnormal PAP smear  Last PAP smear: 2018-normal  Medical History: Anxiety, Chronic Left Calf DVT (since 2013), Bilateral Ankle, Hypertension, Anemia, Elevated Liver Function Tests (attributed to norethindrone and/or pravastatin) and  Transient Ischemic Attack.  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   ROS: Admits to reading glasses, two month history of right upper arm pain (mostly at night-evaluated by PCP), but  denies headache, vision changes, nasal congestion, dysphagia, tinnitus, dizziness, hoarseness, cough,  chest pain, shortness of breath, nausea, vomiting, diarrhea,constipation,  urinary frequency, urgency  dysuria, hematuria, vaginitis symptoms, pelvic pain, swelling of joints,easy bruising,  myalgias, arthralgias, skin rashes, unexplained weight loss and except as  is mentioned in the history of present illness, patient's review of systems is otherwise negative.   Physical Exam  Bp: 100/62    P: 77 bpm-regular    R: 20   Weight: 290  lbs.   Height: 5'5"  BMI: 48.3  Neck: supple without masses or thyromegaly Lungs: clear to auscultation Heart: regular rate and rhythm Abdomen: soft, non-tender and no organomegaly Pelvic:EGBUS- wnl; vagina-normal rugae; uterus-enlarged, mobile and boggy,  cervix without lesions or motion tenderness; adnexae-no tenderness or masses Extremities:  no clubbing, cyanosis or edema, however, focal tenderness at the insertion of the right deltoid;  no erythema, induration or increased warmth   Assesment: Menorrhagia                      Uterine Fibroids                      Dysmenorrhea                      History of Anemia                      Chronic Left Lower Extremity DVT   Disposition:  A discussion was held with patient regarding the indication for her procedure(s) along with the risks, which include but are not limited JS:EGBTDVVO to anesthesia, damage to adjacent organs, Infection, excessive bleeding, formation of scar tissue, early menopause, pelvic prolapse and the possible need for an open abdominal incision.   Benefits of the robotic approach to hysterectomy was reviewed to include lesser postoperative pain, less blood loss during surgery, reduced risk of injury to other organs due to better visualization with a 3-D HD 10 times magnifying camera, shorter hospital stay between 0-1 night and rapid recovery with return to daily routine in 2-3 weeks. Although robotically-assisted hysterectomy has a longer operative time than traditional laparotomy, in a patient with good medical history, the benefits usually outweigh the risks.   Preservation or preventative removal of the ovaries was also reviewed and left to the patient's discretion. Finally, the option of supracervical hysterectomy was also discussed with the possible but yet unconfirmed benefits of reduction of pelvic prolapse. If supracervical hysterectomy is performed, Pap smear screening would continue as currently recommended,  monthly bleeding is possible despite cauterization of cervical canal and a small but possible risk of cervical fibroid development is present   Patient informed about FDA warning on use of morcellator dated 04/04/2013.  The discussion included:  1. Incidence of post-operative diagnosis of sarcoma in women undergoing a hysterectomy is 2:1000  2. Annual incidence of leiomyosarcoma is 0.64/100,000 women  3. Sarcomas have the highest incidence in women over 65  4. Power morcellation involves risks of spreading tissue / disease. In he case of undiagnosed cancer, it may adversely affect the patient's prognosis.  The patient was given the Miralax bowel prep to be completed 24 hours prior to procedure. She verbalized understanding of these risks and pre-operative instructions and has consented to proceed with a Robot Assisted Total Laparoscopic Hysterectomy with Bilateral Salpingectomy at [facility] on February 01, 2018 at noon.   CSN# [id number]   Bashar Milam J. Florene Glen, PA-C  for Dr. Dede Query. Rivard

## 2018-01-29 NOTE — Progress Notes (Signed)
[facility]    HEMATOLOGY/ONCOLOGY CLINIC NOTE  Date of Service: 01/31/18  Patient Care Team: Erline Levine, MD as PCP - General (Internal Medicine)  CHIEF COMPLAINTS/PURPOSE OF CONSULTATION:  F/u for Iron deficiency Anemia H/o Chronic DVT  HISTORY OF PRESENTING ILLNESS:  Cathy Bryant is a wonderful 47 y.o. female who has been referred to [place] by Dr Delsa Bern MD for evaluation and management of h/o DVT - pre-operative evaluation - planning hysterectomy for symptomatic fibroids.  Patient is a poor historian report history of chronic anemia with a hemoglobin of 9.2 on 04/28/2017 thought to be likely related to symptomatic uterine fibroids. She notes that she has had menorrhagia and is on norethindrone for the last 2 months. She also reports a history of hypertension and previous history of DVT.   Patient reports that she has previously had a DVT in her left calf diagnosed in December 2013. Patient reports that she had a primi child delivered in November 2011 at 28 weeks after complicated pregnancy. She notes that she presented with left calf swelling in December 2012 at an outside hospital and reports an ultrasound at the time did not diagnosed DVT. 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 She notes that she was put on aspirin and has not been treated with therapeutic anticoagulation for her DVT.  She does not recollect if she was on oral contraceptive pills around the time of her DVT. She reports that she had broken an ankle from a fall.  Patient reports no family history of venous thromboembolism. Patient denies being a smoker at this time. She is currently on a baby aspirin  INTERVAL HISTORY  Patient is here for f/u regarding a previous history of DVT and iron deficiency anemia. The patient's last visit with [place] was on 09/15/17.  She is accompanied today by her two aunts. The pt reports that she is doing well overall and is awaiting her hysterectomy tomorrow 02/01/18.  She reports that she stopped her Norethindrone and Pravastatin and that her liver function has returned to [facility] as followed by her PCP.  Lab results today (01/31/18) of CBC show all values WNL. Ferritin 01/31/18- 215. S/o IV Injectafer which she tolerated well without any issues.  On review of systems, pt reports no new symptoms and denies heavy bleeding, abdominal pains, problems passing her bowels and urine, and leg swelling.   MEDICAL HISTORY:   #1 hypertension #2 uterine fibroids with menorrhagia and blood loss anemia and dysfunctional uterine bleeding #3 history of left calf DVT in December 2013 - diagnosed late. No treated with anticoagulation. #4 morbid obesity .Body mass index is 48.41 kg/m. #5 history of broken ankle in 2010 from a fall #6 history of preterm delivery at 28 weeks in November 2011   SURGICAL HISTORY: Past Surgical History:  Procedure Laterality Date  . attempted ablation     in MD's Office  . CESAREAN SECTION  2011  . COLONOSCOPY    . DILATION AND CURETTAGE OF UTERUS     In MD's office  . ROBOTIC ASSISTED TOTAL HYSTERECTOMY WITH SALPINGECTOMY Bilateral 02/01/2018   Procedure: ROBOTIC ASSISTED TOTAL HYSTERECTOMY WITH SALPINGECTOMY;  Surgeon: Delsa Bern, MD;  Location: [facility];  Service: Gynecology;  Laterality: Bilateral;  . TONSILLECTOMY    . WISDOM TOOTH EXTRACTION      SOCIAL HISTORY: Social  History   Socioeconomic History  . Marital status: Single    Spouse name: Not on file  . Number of children: Not on file  . Years of education: Not on file  . Highest education level: Not on file  Social Needs  . Financial resource strain: Not on file  . Food insecurity - worry: Not on file  . Food insecurity - inability: Not on file  . Transportation needs - medical: Not on file  . Transportation needs - non-medical:  Not on file  Occupational History  . Not on file  Tobacco Use  . Smoking status: Never Smoker  . Smokeless tobacco: Never Used  Substance and Sexual Activity  . Alcohol use: Yes    Comment: occasional  . Drug use: No  . Sexual activity: No    Birth control/protection: None  Other Topics Concern  . Not on file  Social History Narrative  . Not on file  Patient notes active smoking or significant alcohol use.   FAMILY HISTORY: No family history on file.  ALLERGIES:  has No Known Allergies.  MEDICATIONS:  Current Outpatient Medications  Medication Sig Dispense Refill  . aspirin EC 81 MG tablet Take 81 mg by mouth daily.    . fexofenadine-pseudoephedrine (ALLEGRA-D ALLERGY & CONGESTION) 180-240 MG 24 hr tablet Take 1 tablet by mouth daily. (Patient not taking: Reported on 01/17/2018) 30 tablet 0  . fluticasone (FLONASE) 50 MCG/ACT nasal spray Place 2 sprays into both nostrils daily. (Patient taking differently: Place 2 sprays into both nostrils daily as needed for allergies. ) 16 g 0  . HYDROcodone-homatropine (HYCODAN) 5-1.5 MG/5ML syrup Take 5 mLs by mouth every 6 (six) hours as needed for cough. (Patient not taking: Reported on 01/17/2018) 60 mL 0  . lisinopril-hydrochlorothiazide (PRINZIDE,ZESTORETIC) 20-25 MG tablet Take 1 tablet by mouth daily.    [facility] loratadine (CLARITIN) 10 MG tablet Take 10 mg by mouth daily as needed for allergies.    . Vitamin D, Ergocalciferol, (DRISDOL) 50000 units CAPS capsule Take 50,000 Units by mouth every 7 (seven) days.      No current facility-administered medications for this visit.     REVIEW OF SYSTEMS:    .10 Point review of Systems was done is negative except as noted above.   PHYSICAL EXAMINATION: ECOG PERFORMANCE STATUS: 1 - Symptomatic but completely ambulatory  . Vitals:   01/31/18 0907  BP: (!) 112/93  Pulse: 73  Resp: 18  Temp: 97.9 F (36.6 C)  SpO2: 100%   Filed Weights   01/31/18 0907  Weight: 290 lb 14.4 oz (132 kg)     .Body mass index is 48.41 kg/m.  [facility] Kitchen GENERAL:alert, in no acute distress and comfortable SKIN: no acute rashes, no significant lesions EYES: conjunctiva are pink and non-injected, sclera anicteric OROPHARYNX: MMM, no exudates, no oropharyngeal erythema or ulceration NECK: supple, no JVD LYMPH:  no palpable lymphadenopathy in the cervical, axillary or inguinal regions LUNGS: clear to auscultation b/l with normal respiratory effort HEART: regular rate & rhythm ABDOMEN:  normoactive bowel sounds , non tender, not distended. Extremity: no pedal edema PSYCH: alert & oriented x 3 with fluent speech NEURO: no focal motor/sensory deficits  LABORATORY DATA:  I have reviewed the data as listed  . CBC Latest Ref Rng & Units 01/31/2018 01/18/2018 09/15/2017  WBC 3.9 - 10.3 K/uL 4.3 5.1 5.0  Hemoglobin 12.0 - 15.0 g/dL - 13.1 13.7  Hematocrit 34.8 - 46.6 % 40.9 38.8 42.2  Platelets 145 - 400  K/uL 286 270 320  hgb 13.1 . CBC    Component Value Date/Time   WBC 4.3 01/31/2018 0840   WBC 5.1 01/18/2018 1330   RBC 4.59 01/31/2018 0840   RBC 4.59 01/31/2018 0840   HGB 13.1 01/18/2018 1330   HGB 13.7 09/15/2017 0948   HCT 40.9 01/31/2018 0840   HCT 42.2 09/15/2017 0948   PLT 286 01/31/2018 0840   PLT 320 09/15/2017 0948   MCV 89.1 01/31/2018 0840   MCV 84.9 09/15/2017 0948   MCH 29.2 01/31/2018 0840   MCHC 32.8 01/31/2018 0840   RDW 13.0 01/31/2018 0840   RDW 21.0 (H) 09/15/2017 0948   LYMPHSABS 1.6 01/31/2018 0840   LYMPHSABS 1.9 09/15/2017 0948   MONOABS 0.2 01/31/2018 0840   MONOABS 0.1 09/15/2017 0948   EOSABS 0.1 01/31/2018 0840   EOSABS 0.1 09/15/2017 0948   BASOSABS 0.0 01/31/2018 0840   BASOSABS 0.0 09/15/2017 0948    . CMP Latest Ref Rng & Units 02/01/2018 01/31/2018 01/18/2018  Glucose 70 - 140 mg/dL - 100 97  BUN 7 - 26 mg/dL - 13 15  Creatinine 0.44 - 1.00 mg/dL 0.86 0.88 0.91  Sodium 136 - 145 mmol/L - 140 137  Potassium 3.5 - 5.1 mmol/L - 3.6 3.8  Chloride 98 -  109 mmol/L - 104 104  CO2 22 - 29 mmol/L - 28 27  Calcium 8.4 - 10.4 mg/dL - 9.0 8.8(L)  Total Protein 6.4 - 8.3 g/dL - 6.8 -  Total Bilirubin 0.2 - 1.2 mg/dL - 0.5 -  Alkaline Phos 40 - 150 U/L - 48 -  AST 5 - 34 U/L - 21 -  ALT 0 - 55 U/L - 18 -         Component     Latest Ref Rng & Units 06/30/2017  Anticardiolipin Ab,IgG,Qn     0 - 14 GPL U/mL <9  Anticardiolipin Ab,IgM,Qn     0 - 12 MPL U/mL <9  Anticardiolipin Ab,IgA,Qn     0 - 11 APL U/mL <9  Beta-2 Glycoprotein I Ab, IgG     0 - 20 GPI IgG units <9  Beta-2 Glyco 1 IgA     0 - 25 GPI IgA units <9  Beta-2 Glyco 1 IgM     0 - 32 GPI IgM units <9    RADIOGRAPHIC STUDIES: I have personally reviewed the radiological images as listed and agreed with the findings in the report.  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 - No evidence of superficial thrombosis involving the left lower   extremity. - No evidence of Baker&'s cyst on the left.  Other specific details can be found in the table(s) above. Prepared and Electronically Authenticated by  Servando Snare, MD 2018-07-13T17:23:12   ASSESSMENT & PLAN:   47 y.o.  female with  #1 history of left calf DVT in December 2013.  This will apparently diagnosed late and was noted to be chronic at the time of diagnosis the patient had symptoms for about a year prior to this. Has not been treated with anticoagulation for this. Unclear provoking factors. -We evaluated for factor V Leiden, prothrombin gene mutations and antiphospholipid antibodies - workup was unrevealing. -Patient has multiple acquired risk factors including morbid obesity, limited mobility and ongoing heavy menstrual losses with consequent iron deficiency and possible up-regulation  of factor VIII.  Repeat ultrasound of the left lower  extremity on 06/30/2017 showed only minimal short segment chronic DVT in the gastrocnemius vein and no other significant burden of chronic clot. Plan -no new symptoms related to VTE and no indication for active anticoagulation at this time. -Reasonable to continue baby aspirin. (post-operatively) -Patient would be okay to proceed with planned hysterectomy with appropriate perioperative DVT prophylaxis with Lovenox, from a thrombophilia standpoint. -counseled on need for diet/exercise and weight loss -Anemia has improved with Hgb at 13.1 TODAY, she has responded to IV Iron well.   #2 Iron deficiency anemia related to menorrhagia from dysfunctional uterine bleeding related to uterine fibroids. . Lab Results  Component Value Date   IRON 53 01/31/2018   TIBC 245 01/31/2018   IRONPCTSAT 21 01/31/2018   (Iron and TIBC)  Lab Results  Component Value Date   FERRITIN 215 01/31/2018   .# 3 Thrombocytosis - likely reactive From menorrhagia and from iron deficiency. Resolved with Iron replacement.  PLAN: -ferritin adequate today - no indication for additional IV Iron at this time. -scheduled for hysterectomy to help control significant menorrhagia  #4- Abnormal LFTs  PLAN: -Abnormal AST and ALT have resolved after ceasing  Norethindrone and Pravastatin in conversation with her PCP as we last recommended   RTC with Dr Irene Limbo as needed  All of the patients questions were answered with apparent satisfaction. The patient knows to call the clinic with any problems, questions or concerns.  . The total time spent in the appointment was 15 minutes and more than 50% was on counseling and direct patient cares.    Sullivan Lone MD [place] AAHIVMS [facility] Hematology/Oncology Physician [facility]  (Office):       [phone] (Work cell):  [phone] (Fax):           [phone]  This document serves as a record of services personally performed by Sullivan Lone, MD. It was created on  his behalf by Baldwin Jamaica, a trained medical scribe. The creation of this record is based on the scribe's personal observations and the provider's statements to them.   .I have reviewed the above documentation for accuracy and completeness, and I agree with the above. Brunetta Genera MD MS

## 2018-01-31 ENCOUNTER — Inpatient Hospital Stay (HOSPITAL_BASED_OUTPATIENT_CLINIC_OR_DEPARTMENT_OTHER): Payer: BLUE CROSS/BLUE SHIELD | Admitting: Hematology

## 2018-01-31 ENCOUNTER — Inpatient Hospital Stay: Payer: BLUE CROSS/BLUE SHIELD | Attending: Hematology

## 2018-01-31 ENCOUNTER — Telehealth: Payer: Self-pay

## 2018-01-31 VITALS — BP 112/93 | HR 73 | Temp 97.9°F | Resp 18 | Ht 65.0 in | Wt 290.9 lb

## 2018-01-31 DIAGNOSIS — Z6841 Body Mass Index (BMI) 40.0 and over, adult: Secondary | ICD-10-CM | POA: Diagnosis not present

## 2018-01-31 DIAGNOSIS — R945 Abnormal results of liver function studies: Secondary | ICD-10-CM

## 2018-01-31 DIAGNOSIS — N920 Excessive and frequent menstruation with regular cycle: Secondary | ICD-10-CM

## 2018-01-31 DIAGNOSIS — Z86718 Personal history of other venous thrombosis and embolism: Secondary | ICD-10-CM | POA: Insufficient documentation

## 2018-01-31 DIAGNOSIS — D500 Iron deficiency anemia secondary to blood loss (chronic): Secondary | ICD-10-CM

## 2018-01-31 DIAGNOSIS — N938 Other specified abnormal uterine and vaginal bleeding: Secondary | ICD-10-CM

## 2018-01-31 DIAGNOSIS — I10 Essential (primary) hypertension: Secondary | ICD-10-CM | POA: Insufficient documentation

## 2018-01-31 DIAGNOSIS — D259 Leiomyoma of uterus, unspecified: Secondary | ICD-10-CM | POA: Diagnosis not present

## 2018-01-31 DIAGNOSIS — Z7982 Long term (current) use of aspirin: Secondary | ICD-10-CM | POA: Insufficient documentation

## 2018-01-31 DIAGNOSIS — R7989 Other specified abnormal findings of blood chemistry: Secondary | ICD-10-CM

## 2018-01-31 DIAGNOSIS — Z79899 Other long term (current) drug therapy: Secondary | ICD-10-CM | POA: Insufficient documentation

## 2018-01-31 DIAGNOSIS — I82502 Chronic embolism and thrombosis of unspecified deep veins of left lower extremity: Secondary | ICD-10-CM

## 2018-01-31 LAB — RETICULOCYTES
RBC.: 4.59 MIL/uL (ref 3.70–5.45)
RETIC CT PCT: 1.8 % (ref 0.7–2.1)
Retic Count, Absolute: 82.6 10*3/uL (ref 33.7–90.7)

## 2018-01-31 LAB — IRON AND TIBC
IRON: 53 ug/dL (ref 41–142)
Saturation Ratios: 21 % (ref 21–57)
TIBC: 245 ug/dL (ref 236–444)
UIBC: 193 ug/dL

## 2018-01-31 LAB — CBC WITH DIFFERENTIAL (CANCER CENTER ONLY)
BASOS PCT: 1 %
Basophils Absolute: 0 10*3/uL (ref 0.0–0.1)
EOS ABS: 0.1 10*3/uL (ref 0.0–0.5)
EOS PCT: 3 %
HCT: 40.9 % (ref 34.8–46.6)
Hemoglobin: 13.4 g/dL (ref 11.6–15.9)
Lymphocytes Relative: 38 %
Lymphs Abs: 1.6 10*3/uL (ref 0.9–3.3)
MCH: 29.2 pg (ref 25.1–34.0)
MCHC: 32.8 g/dL (ref 31.5–36.0)
MCV: 89.1 fL (ref 79.5–101.0)
Monocytes Absolute: 0.2 10*3/uL (ref 0.1–0.9)
Monocytes Relative: 5 %
Neutro Abs: 2.3 10*3/uL (ref 1.5–6.5)
Neutrophils Relative %: 53 %
PLATELETS: 286 10*3/uL (ref 145–400)
RBC: 4.59 MIL/uL (ref 3.70–5.45)
RDW: 13 % (ref 11.2–14.5)
WBC: 4.3 10*3/uL (ref 3.9–10.3)

## 2018-01-31 LAB — FERRITIN: Ferritin: 215 ng/mL (ref 9–269)

## 2018-01-31 LAB — COMPREHENSIVE METABOLIC PANEL
ALK PHOS: 48 U/L (ref 40–150)
ALT: 18 U/L (ref 0–55)
AST: 21 U/L (ref 5–34)
Albumin: 3.4 g/dL — ABNORMAL LOW (ref 3.5–5.0)
Anion gap: 8 (ref 3–11)
BILIRUBIN TOTAL: 0.5 mg/dL (ref 0.2–1.2)
BUN: 13 mg/dL (ref 7–26)
CALCIUM: 9 mg/dL (ref 8.4–10.4)
CO2: 28 mmol/L (ref 22–29)
Chloride: 104 mmol/L (ref 98–109)
Creatinine, Ser: 0.88 mg/dL (ref 0.60–1.10)
GFR calc Af Amer: >60 mL/min (ref >60)
GFR calc non Af Amer: >60 mL/min (ref >60)
Glucose, Bld: 100 mg/dL (ref 70–140)
POTASSIUM: 3.6 mmol/L (ref 3.5–5.1)
Sodium: 140 mmol/L (ref 136–145)
TOTAL PROTEIN: 6.8 g/dL (ref 6.4–8.3)

## 2018-01-31 NOTE — Telephone Encounter (Signed)
Return to [place] as needed. Per 2/13 los

## 2018-02-01 ENCOUNTER — Ambulatory Visit (HOSPITAL_COMMUNITY): Payer: BLUE CROSS/BLUE SHIELD | Admitting: Anesthesiology

## 2018-02-01 ENCOUNTER — Encounter (HOSPITAL_COMMUNITY)
Admission: AD | Disposition: A | Discharge status: Home / Self Care | Payer: Self-pay | Source: Ambulatory Visit | Attending: Obstetrics and Gynecology

## 2018-02-01 ENCOUNTER — Encounter (HOSPITAL_COMMUNITY): Payer: Self-pay

## 2018-02-01 ENCOUNTER — Other Ambulatory Visit: Payer: Self-pay

## 2018-02-01 ENCOUNTER — Observation Stay (HOSPITAL_COMMUNITY)
Admission: AD | Admit: 2018-02-01 | Discharge: 2018-02-02 | Disposition: A | Discharge status: Home / Self Care | Payer: BLUE CROSS/BLUE SHIELD | Source: Ambulatory Visit | Attending: Obstetrics and Gynecology | Admitting: Obstetrics and Gynecology

## 2018-02-01 DIAGNOSIS — Z6841 Body Mass Index (BMI) 40.0 and over, adult: Secondary | ICD-10-CM | POA: Diagnosis not present

## 2018-02-01 DIAGNOSIS — N879 Dysplasia of cervix uteri, unspecified: Secondary | ICD-10-CM | POA: Insufficient documentation

## 2018-02-01 DIAGNOSIS — Z86718 Personal history of other venous thrombosis and embolism: Secondary | ICD-10-CM | POA: Insufficient documentation

## 2018-02-01 DIAGNOSIS — F419 Anxiety disorder, unspecified: Secondary | ICD-10-CM | POA: Diagnosis not present

## 2018-02-01 DIAGNOSIS — Z8673 Personal history of transient ischemic attack (TIA), and cerebral infarction without residual deficits: Secondary | ICD-10-CM | POA: Diagnosis not present

## 2018-02-01 DIAGNOSIS — Z79899 Other long term (current) drug therapy: Secondary | ICD-10-CM | POA: Insufficient documentation

## 2018-02-01 DIAGNOSIS — I10 Essential (primary) hypertension: Secondary | ICD-10-CM | POA: Diagnosis not present

## 2018-02-01 DIAGNOSIS — D252 Subserosal leiomyoma of uterus: Secondary | ICD-10-CM | POA: Diagnosis not present

## 2018-02-01 DIAGNOSIS — N920 Excessive and frequent menstruation with regular cycle: Secondary | ICD-10-CM | POA: Diagnosis not present

## 2018-02-01 DIAGNOSIS — D259 Leiomyoma of uterus, unspecified: Secondary | ICD-10-CM | POA: Diagnosis present

## 2018-02-01 DIAGNOSIS — Z7982 Long term (current) use of aspirin: Secondary | ICD-10-CM | POA: Diagnosis not present

## 2018-02-01 DIAGNOSIS — N946 Dysmenorrhea, unspecified: Secondary | ICD-10-CM | POA: Insufficient documentation

## 2018-02-01 HISTORY — PX: ROBOTIC ASSISTED TOTAL HYSTERECTOMY WITH SALPINGECTOMY: SHX6679

## 2018-02-01 LAB — CBC
HCT: 39.3 % (ref 36.0–46.0)
Hemoglobin: 13.1 g/dL (ref 12.0–15.0)
MCH: 29 pg (ref 26.0–34.0)
MCHC: 33.3 g/dL (ref 30.0–36.0)
MCV: 87.1 fL (ref 78.0–100.0)
PLATELETS: 284 10*3/uL (ref 150–400)
RBC: 4.51 MIL/uL (ref 3.87–5.11)
RDW: 13 % (ref 11.5–15.5)
WBC: 11.2 10*3/uL — ABNORMAL HIGH (ref 4.0–10.5)

## 2018-02-01 LAB — CREATININE, SERUM
CREATININE: 0.86 mg/dL (ref 0.44–1.00)
GFR calc Af Amer: >60 mL/min (ref >60)
GFR calc non Af Amer: >60 mL/min (ref >60)

## 2018-02-01 LAB — PREGNANCY, URINE: Preg Test, Ur: NEGATIVE

## 2018-02-01 SURGERY — ROBOTIC ASSISTED TOTAL HYSTERECTOMY WITH SALPINGECTOMY
Anesthesia: General | Site: Abdomen | Laterality: Bilateral

## 2018-02-01 MED ORDER — LACTATED RINGERS IV SOLN
INTRAVENOUS | Status: DC
  Administered 2018-02-01: 18:00:00 via INTRAVENOUS

## 2018-02-01 MED ORDER — SUGAMMADEX SODIUM 200 MG/2ML IV SOLN
INTRAVENOUS | Status: AC
Start: 2018-02-01 — End: 2018-02-01
  Filled 2018-02-01: qty 2

## 2018-02-01 MED ORDER — PHENYLEPHRINE HCL 10 MG/ML IJ SOLN
INTRAMUSCULAR | Status: DC | PRN
  Administered 2018-02-01: 40 ug via INTRAVENOUS
  Administered 2018-02-01 (×3): 80 ug via INTRAVENOUS
  Administered 2018-02-01 (×2): 40 ug via INTRAVENOUS

## 2018-02-01 MED ORDER — CEFOTETAN DISODIUM-DEXTROSE 2-2.08 GM-%(50ML) IV SOLR
2.0000 g | INTRAVENOUS | Status: AC
  Administered 2018-02-01: 2 g via INTRAVENOUS

## 2018-02-01 MED ORDER — ONDANSETRON HCL 4 MG/2ML IJ SOLN
INTRAMUSCULAR | Status: AC
  Filled 2018-02-01: qty 2

## 2018-02-01 MED ORDER — ONDANSETRON HCL 4 MG PO TABS: 4.0000 mg | ORAL_TABLET | Freq: Three times a day (TID) | ORAL | Status: DC | PRN

## 2018-02-01 MED ORDER — FENTANYL CITRATE (PF) 100 MCG/2ML IJ SOLN
INTRAMUSCULAR | Status: DC | PRN
  Administered 2018-02-01 (×2): 50 ug via INTRAVENOUS
  Administered 2018-02-01: 100 ug via INTRAVENOUS
  Administered 2018-02-01: 50 ug via INTRAVENOUS

## 2018-02-01 MED ORDER — PROPOFOL 10 MG/ML IV BOLUS
INTRAVENOUS | Status: AC
  Filled 2018-02-01: qty 20

## 2018-02-01 MED ORDER — PHENYLEPHRINE 40 MCG/ML (10ML) SYRINGE FOR IV PUSH (FOR BLOOD PRESSURE SUPPORT)
PREFILLED_SYRINGE | INTRAVENOUS | Status: AC
  Filled 2018-02-01: qty 10

## 2018-02-01 MED ORDER — KETOROLAC TROMETHAMINE 30 MG/ML IJ SOLN
INTRAMUSCULAR | Status: DC | PRN
  Administered 2018-02-01: 30 mg via INTRAVENOUS

## 2018-02-01 MED ORDER — ARTIFICIAL TEARS OPHTHALMIC OINT
TOPICAL_OINTMENT | OPHTHALMIC | Status: DC | PRN
  Administered 2018-02-01: 1 via OPHTHALMIC

## 2018-02-01 MED ORDER — MIDAZOLAM HCL 2 MG/2ML IJ SOLN
INTRAMUSCULAR | Status: DC | PRN
  Administered 2018-02-01: 2 mg via INTRAVENOUS

## 2018-02-01 MED ORDER — IBUPROFEN 600 MG PO TABS
600.0000 mg | ORAL_TABLET | Freq: Four times a day (QID) | ORAL | Status: DC | PRN
  Administered 2018-02-02 (×2): 600 mg via ORAL
  Filled 2018-02-01 (×2): qty 1

## 2018-02-01 MED ORDER — SCOPOLAMINE 1 MG/3DAYS TD PT72
1.0000 | MEDICATED_PATCH | Freq: Once | TRANSDERMAL | Status: DC
  Administered 2018-02-01: 1.5 mg via TRANSDERMAL

## 2018-02-01 MED ORDER — SODIUM CHLORIDE 0.9 % IV SOLN
INTRAVENOUS | Status: DC | PRN
  Administered 2018-02-01: 103 mL

## 2018-02-01 MED ORDER — KETOROLAC TROMETHAMINE 30 MG/ML IJ SOLN
INTRAMUSCULAR | Status: AC
  Filled 2018-02-01: qty 1

## 2018-02-01 MED ORDER — CEFOTETAN DISODIUM-DEXTROSE 2-2.08 GM-%(50ML) IV SOLR
INTRAVENOUS | Status: AC
  Filled 2018-02-01: qty 50

## 2018-02-01 MED ORDER — DEXAMETHASONE SODIUM PHOSPHATE 10 MG/ML IJ SOLN
INTRAMUSCULAR | Status: DC | PRN
  Administered 2018-02-01: 10 mg via INTRAVENOUS

## 2018-02-01 MED ORDER — LACTATED RINGERS IV SOLN
INTRAVENOUS | Status: DC
  Administered 2018-02-01 (×3): via INTRAVENOUS

## 2018-02-01 MED ORDER — ONDANSETRON HCL 4 MG/2ML IJ SOLN: 4.0000 mg | Freq: Four times a day (QID) | INTRAMUSCULAR | Status: DC | PRN

## 2018-02-01 MED ORDER — ROPIVACAINE HCL 5 MG/ML IJ SOLN
INTRAMUSCULAR | Status: AC
  Filled 2018-02-01: qty 60

## 2018-02-01 MED ORDER — LISINOPRIL 20 MG PO TABS
20.0000 mg | ORAL_TABLET | Freq: Every day | ORAL | Status: DC
  Administered 2018-02-02: 20 mg via ORAL
  Filled 2018-02-01 (×3): qty 1

## 2018-02-01 MED ORDER — OXYCODONE HCL 5 MG PO TABS: 5.0000 mg | ORAL_TABLET | Freq: Once | ORAL | Status: DC | PRN

## 2018-02-01 MED ORDER — HYDROMORPHONE HCL 1 MG/ML IJ SOLN
0.2500 mg | INTRAMUSCULAR | Status: DC | PRN
  Administered 2018-02-01 (×2): 0.25 mg via INTRAVENOUS

## 2018-02-01 MED ORDER — OXYCODONE-ACETAMINOPHEN 5-325 MG PO TABS
1.0000 | ORAL_TABLET | Freq: Four times a day (QID) | ORAL | Status: DC | PRN
  Administered 2018-02-02: 1 via ORAL
  Filled 2018-02-01: qty 1

## 2018-02-01 MED ORDER — MIDAZOLAM HCL 2 MG/2ML IJ SOLN
INTRAMUSCULAR | Status: AC
  Filled 2018-02-01: qty 2

## 2018-02-01 MED ORDER — DEXAMETHASONE SODIUM PHOSPHATE 10 MG/ML IJ SOLN
INTRAMUSCULAR | Status: AC
  Filled 2018-02-01: qty 1

## 2018-02-01 MED ORDER — DOCUSATE SODIUM 100 MG PO CAPS
100.0000 mg | ORAL_CAPSULE | Freq: Two times a day (BID) | ORAL | Status: DC
  Administered 2018-02-01 – 2018-02-02 (×2): 100 mg via ORAL
  Filled 2018-02-01 (×2): qty 1

## 2018-02-01 MED ORDER — OXYCODONE HCL 5 MG/5ML PO SOLN: 5.0000 mg | Freq: Once | ORAL | Status: DC | PRN

## 2018-02-01 MED ORDER — LIDOCAINE HCL (CARDIAC) 20 MG/ML IV SOLN
INTRAVENOUS | Status: AC
  Filled 2018-02-01: qty 5

## 2018-02-01 MED ORDER — ONDANSETRON HCL 4 MG/2ML IJ SOLN
INTRAMUSCULAR | Status: DC | PRN
  Administered 2018-02-01: 4 mg via INTRAVENOUS

## 2018-02-01 MED ORDER — LISINOPRIL-HYDROCHLOROTHIAZIDE 20-25 MG PO TABS: 1.0000 | ORAL_TABLET | Freq: Every day | ORAL | Status: DC

## 2018-02-01 MED ORDER — SODIUM CHLORIDE 0.9 % IJ SOLN
INTRAMUSCULAR | Status: AC
  Filled 2018-02-01: qty 100

## 2018-02-01 MED ORDER — ENOXAPARIN SODIUM 80 MG/0.8ML ~~LOC~~ SOLN
70.0000 mg | SUBCUTANEOUS | Status: DC
  Administered 2018-02-02: 70 mg via SUBCUTANEOUS
  Filled 2018-02-01 (×2): qty 0.8

## 2018-02-01 MED ORDER — ROCURONIUM BROMIDE 100 MG/10ML IV SOLN
INTRAVENOUS | Status: AC
  Filled 2018-02-01: qty 1

## 2018-02-01 MED ORDER — STERILE WATER FOR IRRIGATION IR SOLN
Status: DC | PRN
  Administered 2018-02-01: 1000 mL via INTRAVESICAL

## 2018-02-01 MED ORDER — FENTANYL CITRATE (PF) 250 MCG/5ML IJ SOLN
INTRAMUSCULAR | Status: AC
  Filled 2018-02-01: qty 5

## 2018-02-01 MED ORDER — HYDROCHLOROTHIAZIDE 25 MG PO TABS
25.0000 mg | ORAL_TABLET | Freq: Every day | ORAL | Status: DC
  Administered 2018-02-02: 25 mg via ORAL
  Filled 2018-02-01: qty 1

## 2018-02-01 MED ORDER — PROPOFOL 10 MG/ML IV BOLUS
INTRAVENOUS | Status: DC | PRN
  Administered 2018-02-01: 20 mg via INTRAVENOUS
  Administered 2018-02-01: 180 mg via INTRAVENOUS

## 2018-02-01 MED ORDER — LIDOCAINE HCL (CARDIAC) 20 MG/ML IV SOLN
INTRAVENOUS | Status: DC | PRN
  Administered 2018-02-01: 80 mg via INTRAVENOUS

## 2018-02-01 MED ORDER — SUGAMMADEX SODIUM 200 MG/2ML IV SOLN
INTRAVENOUS | Status: DC | PRN
  Administered 2018-02-01: 200 mg via INTRAVENOUS

## 2018-02-01 MED ORDER — ARTIFICIAL TEARS OPHTHALMIC OINT
TOPICAL_OINTMENT | OPHTHALMIC | Status: AC
  Filled 2018-02-01: qty 3.5

## 2018-02-01 MED ORDER — ROCURONIUM BROMIDE 100 MG/10ML IV SOLN
INTRAVENOUS | Status: DC | PRN
  Administered 2018-02-01 (×2): 10 mg via INTRAVENOUS
  Administered 2018-02-01: 5 mg via INTRAVENOUS
  Administered 2018-02-01: 10 mg via INTRAVENOUS
  Administered 2018-02-01: 50 mg via INTRAVENOUS

## 2018-02-01 MED ORDER — MENTHOL 3 MG MT LOZG: 1.0000 | LOZENGE | OROMUCOSAL | Status: DC | PRN

## 2018-02-01 MED ORDER — SODIUM CHLORIDE 0.9 % IR SOLN
Status: DC | PRN
  Administered 2018-02-01: 3000 mL

## 2018-02-01 MED ORDER — KETOROLAC TROMETHAMINE 30 MG/ML IJ SOLN
30.0000 mg | Freq: Four times a day (QID) | INTRAMUSCULAR | Status: DC
Start: 2018-02-01 — End: 2018-02-02
  Administered 2018-02-01: 30 mg via INTRAVENOUS
  Filled 2018-02-01: qty 1

## 2018-02-01 MED ORDER — HYDROMORPHONE HCL 1 MG/ML IJ SOLN
INTRAMUSCULAR | Status: AC
  Filled 2018-02-01: qty 0.5

## 2018-02-01 MED ORDER — SCOPOLAMINE 1 MG/3DAYS TD PT72
MEDICATED_PATCH | TRANSDERMAL | Status: AC
  Administered 2018-02-01: 1.5 mg via TRANSDERMAL
  Filled 2018-02-01: qty 1

## 2018-02-01 SURGICAL SUPPLY — 55 items
BARRIER ADHS 3X4 INTERCEED (GAUZE/BANDAGES/DRESSINGS) ×2 IMPLANT
CATH FOLEY 3WAY  5CC 16FR (CATHETERS) ×1
CATH FOLEY 3WAY 5CC 16FR (CATHETERS) ×1 IMPLANT
CLOTH BEACON ORANGE TIMEOUT ST (SAFETY) ×2 IMPLANT
CONT PATH 16OZ SNAP LID 3702 (MISCELLANEOUS) ×2 IMPLANT
CORD ACTIVE DISPOSABLE (ELECTRODE) ×1
CORD ELECTRO ACTIVE DISP (ELECTRODE) ×1 IMPLANT
COVER BACK TABLE 60X90IN (DRAPES) ×4 IMPLANT
COVER TIP SHEARS 8 DVNC (MISCELLANEOUS) ×1 IMPLANT
COVER TIP SHEARS 8MM DA VINCI (MISCELLANEOUS) ×1
DECANTER SPIKE VIAL GLASS SM (MISCELLANEOUS) ×8 IMPLANT
DERMABOND ADVANCED (GAUZE/BANDAGES/DRESSINGS) ×1
DERMABOND ADVANCED .7 DNX12 (GAUZE/BANDAGES/DRESSINGS) ×1 IMPLANT
DURAPREP 26ML APPLICATOR (WOUND CARE) ×2 IMPLANT
ELECT REM PT RETURN 9FT ADLT (ELECTROSURGICAL) ×2
ELECTRODE REM PT RTRN 9FT ADLT (ELECTROSURGICAL) ×1 IMPLANT
GLOVE BIO SURGEON STRL SZ7 (GLOVE) ×2 IMPLANT
GLOVE BIOGEL PI IND STRL 7.0 (GLOVE) ×5 IMPLANT
GLOVE BIOGEL PI INDICATOR 7.0 (GLOVE) ×5
GLOVE ECLIPSE 6.5 STRL STRAW (GLOVE) ×6 IMPLANT
KIT ACCESSORY DA VINCI DISP (KITS) ×1
KIT ACCESSORY DVNC DISP (KITS) ×1 IMPLANT
LEGGING LITHOTOMY PAIR STRL (DRAPES) ×2 IMPLANT
OCCLUDER COLPOPNEUMO (BALLOONS) ×2 IMPLANT
PACK ROBOT WH (CUSTOM PROCEDURE TRAY) ×2 IMPLANT
PACK ROBOTIC GOWN (GOWN DISPOSABLE) ×2 IMPLANT
PACK TRENDGUARD 450 HYBRID PRO (MISCELLANEOUS) IMPLANT
PACK TRENDGUARD 600 HYBRD PROC (MISCELLANEOUS) ×1 IMPLANT
PAD PREP 24X48 CUFFED NSTRL (MISCELLANEOUS) ×2 IMPLANT
POUCH LAPAROSCOPIC INSTRUMENT (MISCELLANEOUS) ×2 IMPLANT
PROTECTOR NERVE ULNAR (MISCELLANEOUS) ×6 IMPLANT
SET CYSTO W/LG BORE CLAMP LF (SET/KITS/TRAYS/PACK) ×2 IMPLANT
SET IRRIG TUBING LAPAROSCOPIC (IRRIGATION / IRRIGATOR) ×2 IMPLANT
SET TRI-LUMEN FLTR TB AIRSEAL (TUBING) ×2 IMPLANT
SUT DVC VLOC 180 0 12IN GS21 (SUTURE) ×4
SUT MNCRL AB 3-0 PS2 27 (SUTURE) ×4 IMPLANT
SUT VIC AB 0 CT1 27 (SUTURE) ×2
SUT VIC AB 0 CT1 27XBRD ANBCTR (SUTURE) ×2 IMPLANT
SUT VICRYL 0 UR6 27IN ABS (SUTURE) ×2 IMPLANT
SUT VLOC 180 0 9IN  GS21 (SUTURE) ×2
SUT VLOC 180 0 9IN GS21 (SUTURE) ×2 IMPLANT
SUTURE DVC VLC 180 0 12IN GS21 (SUTURE) ×2 IMPLANT
SYSTEM CONVERTIBLE TROCAR (TROCAR) ×2 IMPLANT
TIP RUMI ORANGE 6.7MMX12CM (TIP) IMPLANT
TIP UTERINE 5.1X6CM LAV DISP (MISCELLANEOUS) IMPLANT
TIP UTERINE 6.7X10CM GRN DISP (MISCELLANEOUS) IMPLANT
TIP UTERINE 6.7X6CM WHT DISP (MISCELLANEOUS) ×2 IMPLANT
TIP UTERINE 6.7X8CM BLUE DISP (MISCELLANEOUS) IMPLANT
TOWEL OR 17X24 6PK STRL BLUE (TOWEL DISPOSABLE) ×6 IMPLANT
TRENDGUARD 450 HYBRID PRO PACK (MISCELLANEOUS)
TRENDGUARD 600 HYBRID PROC PK (MISCELLANEOUS) ×2
TROCAR 12M 150ML BLUNT (TROCAR) ×2 IMPLANT
TROCAR DISP BLADELESS 8 DVNC (TROCAR) IMPLANT
TROCAR DISP BLADELESS 8MM (TROCAR)
TROCAR PORT AIRSEAL 8X120 (TROCAR) ×2 IMPLANT

## 2018-02-01 NOTE — Discharge Instructions (Signed)
Call [facility] @ [phone] if:  You have a temperature greater than or equal to 100.4 degrees Farenheit orally You have pain that is not made better by the pain medication given and taken as directed You have excessive bleeding or problems urinating  Take Colace (Docusate Sodium/Stool Softener) 100 mg 2-3 times daily while taking narcotic pain medicine to avoid constipation or until bowel movements are regular. Take,  with a meal,  Ibuprofen 600 mg every 6 hours for 5 days then as needed for pain  You may drive after 1 week You may walk up steps  You may shower  You may resume a regular diet  Keep incisions clean and dry Do not lift over 15 pounds for 6 weeks Avoid anything in vagina for 6 weeks (or until after your post-operative visit)

## 2018-02-01 NOTE — Op Note (Signed)
Preoperative diagnosis: uterine fibroids  Postoperative diagnosis: Same   Anesthesia: General  Anesthesiologist: Dr. Marcie Bal  Procedure: Robotically assisted total hysterectomy with bilateral salpingectomy  Surgeon: Dr. Katharine Look Jori Frerichs  Assistant: Earnstine Regal P.A.-C.  Estimated blood loss: 50 cc  Procedure:  After being informed of the planned procedure with possible complications including but not limited to bleeding, infection, injury to other organs, need for laparotomy, possible need for morcellation with risks and benefits reviewed, expected hospital stay and recovery, informed consent is obtained and patient is taken to or #7. She is placed in  lithotomy position on Trengard with both arms padded and tucked on each side and bilateral knee-high sequential compressive devices. She is given general anesthesia with endotracheal intubation without any complication. She is prepped and draped in a sterile fashion. A three-way Foley catheter is inserted in her bladder.  Pelvic exam reveals: 16 week size uterus and no pelvic mass  A weighted speculum is inserted in the vagina and the anterior lip of the cervix is grasped with a tenaculum forcep. We proceed with a paracervical block and vaginal infiltration using ropivacaine 0.5% diluted 1 in 1 with saline. The uterus was then sounded at 7 cm. We easily dilate the cervix using Hegar dilator to  #27 which allows for easy placement of the intrauterine RUMI manipulator with a 3.5 KOH ring and a vaginal occluder. The ring is sutured to the cervix with 0 Vicryl.  Trocar placement is decided. We infiltrate 4 cm above the umbilicus with 10 cc of ropivacaine per protocol and perform a 10 mm vertical incision which is brought down bluntly to the fascia. The fascia is identified and grasped with Coker forceps. The fascia is incised with Mayo scissors. Peritoneum is entered bluntly. A pursestring suture of 0 Vicryl is placed on the fascia and a 10 mm  Hassan trocar is easily inserted in the abdominal cavity held in placed with a Purstring suture. This allows for easy insufflation of a pneumoperitoneum using warmed CO2 at a maximum pressure of 15 mm of mercury. 60 cc of Ropivacaine 0.5 % diluted 1 in 1 is sent in the pelvis and the patient is positioned in reverse Trendelenburg. We then placed two 22mm robotic trocar on the left, one 79mm robotic trocar on the right and one 8 mm patient's side assistant trocar on the right  after infiltrating every site  with ropivacaine per protocol. The robot is docked, after partial laparoscopic lysis of adhesions,  on the left of the patient after positioning her in Trendelenburg. A monopolar scissor is inserted in arm #1, a PK gyrus forcep is inserted in arm #2 and a Prograsp forceps is inserted in arm #3.  Preparation and docking is completed in 64 minutes.  Observation: The omentum is completely adherent to the abdominal wall. We proceed with partial lysis of adhesions using hot monopolar scissors in order to safely place the operating trocars. Uterus has a dominant right cornual fibroid measuring 6 cm. Multiple adhesions are noted between the sigmoid colon and the left pelvic wall.   We proceed with systematic sharp dissection of adhesions until the omentum falls back in the abdomen and the sigmoid colon is freed. This allows [place] to clearly visualize both ureters. We start on the right side by cauterizing the mesosalpynx , the right utero-ovarian ligament and the right round ligament . This is then sectioned with monopolar scissors. This gives [place] entry into the retroperitoneal space with an easy dissection of the anterior broad ligament.  This was opened all the way to the left round ligament.   We then proceed with systematic dissection of the bladder over way from the anterior vaginal cuff which is easily identified with the KOH ring. The plane of dissection is easily identified and confirmed after filling the bladder  with 200 cc of saline. We are able to dissect the bladder 2 cm below the KOH ring. We then opened the posterior right broad ligament all the way to the posterior KOH ring after identifying the full course of the right ureter.   Moving to the left side we cauterize the left round ligament , the left utero-ovarian ligament and  the mesosalpinx in between. This pedicle is the sectionned. Entry into the retroperitoneal space allows [place] to complete dissection of the bladder on the left side and skeletonized the all uterine vessels. The left broad ligament is then dissected all the way to the posterior KOH ring after identifying the full course of the left ureter.   With pressure on the KOH ring and the bladder fully dissected below we are able to cauterize the uterine vessels on both sides at the level of the KOH ring.  The vaginal occluder is inflated and we proceed with a 360 colpotomy using an open monopolar scissors and freeing the uterus entirely.  The uterus is delivered vaginally with simple traction. The vaginal occluder is reinserted in the vagina to maintain pneumoperitoneum.  Instruments are then modified for a suture cut in arm #1 and a long tip forcep in arm #2. We proceed with closure of the vaginal cuff with 2 runiing sutures of 0 V-Lock.We irrigated profusely with warm saline and confirm a satisfactory hemostasis as well as 2 normal ureters with good mobility and no dilatation. We then proceed with bilateral salpyngectomy using bipolar cauterization and hot scissors. Both tubes are removed from the abdomen through the assistant trocar.   All instruments are then removed and the robot is undocked. Console time: 2 hours and 10 minutes.  All trochars are removed under direct visualization after evacuating the pneumoperitoneum.  The fascia of the supraumbilical incision is closed with the previously placed pursestring suture of 0 Vicryl. All incisions are then closed with subcuticular suture  of 3-0 Monocryl and Dermabond.  A speculum is inserted in the vagina to confirm a adequate closure of the vaginal cuff and good hemostasis.  Instrument and sponge count is complete x2. Estimated blood loss is 50 cc. The procedure is well tolerated by the patient is taken to recovery room in a well and stable condition.  Specimen: Uterus and tubes weighing 267 g sent to pathology

## 2018-02-01 NOTE — Anesthesia Preprocedure Evaluation (Signed)
Anesthesia Evaluation  Patient identified by MRN, date of birth, ID band Patient awake    Reviewed: Allergy & Precautions, H&P , NPO status , Patient's Chart, lab work & pertinent test results  Airway Mallampati: II   Neck ROM: full    Dental   Pulmonary neg pulmonary ROS,    breath sounds clear to auscultation       Cardiovascular hypertension,  Rhythm:regular Rate:Normal     Neuro/Psych    GI/Hepatic   Endo/Other  Morbid obesity  Renal/GU      Musculoskeletal   Abdominal   Peds  Hematology   Anesthesia Other Findings   Reproductive/Obstetrics                             Anesthesia Physical Anesthesia Plan  ASA: II  Anesthesia Plan: General   Post-op Pain Management:    Induction: Intravenous  PONV Risk Score and Plan: 3 and Ondansetron, Dexamethasone, Midazolam and Treatment may vary due to age or medical condition  Airway Management Planned: Oral ETT  Additional Equipment:   Intra-op Plan:   Post-operative Plan: Extubation in OR  Informed Consent: I have reviewed the patients History and Physical, chart, labs and discussed the procedure including the risks, benefits and alternatives for the proposed anesthesia with the patient or authorized representative who has indicated his/her understanding and acceptance.     Plan Discussed with: CRNA, Anesthesiologist and Surgeon  Anesthesia Plan Comments:         Anesthesia Quick Evaluation

## 2018-02-01 NOTE — Anesthesia Procedure Notes (Signed)
Procedure Name: Intubation Date/Time: 02/01/2018 12:19 PM Performed by: Asher Muir, CRNA Pre-anesthesia Checklist: Patient identified, Emergency Drugs available, Suction available and Patient being monitored Patient Re-evaluated:Patient Re-evaluated prior to induction Oxygen Delivery Method: Circle system utilized and Simple face mask Preoxygenation: Pre-oxygenation with 100% oxygen Induction Type: IV induction Ventilation: Mask ventilation without difficulty Laryngoscope Size: Mac and 3 Grade View: Grade II Tube type: Oral Tube size: 7.0 mm Number of attempts: 1 Airway Equipment and Method: Stylet Placement Confirmation: ETT inserted through vocal cords under direct vision,  positive ETCO2 and breath sounds checked- equal and bilateral Secured at: 21 (right lip) cm Tube secured with: Tape Dental Injury: Teeth and Oropharynx as per pre-operative assessment

## 2018-02-01 NOTE — Interval H&P Note (Signed)
History and Physical Interval Note:  02/01/2018 12:01 PM  Cathy Bryant  has presented today for surgery, with the diagnosis of D25.9 Fibroids  The various methods of treatment have been discussed with the patient and family. After consideration of risks, benefits and other options for treatment, the patient has consented to  Procedure(s) with comments: ROBOTIC ASSISTED TOTAL HYSTERECTOMY WITH SALPINGECTOMY (Bilateral) - 3.5 Hours.  Put this case before the Robotic BSO, please. as a surgical intervention .  The patient's history has been reviewed, patient examined, no change in status, stable for surgery.  I have reviewed the patient's chart and labs.  Questions were answered to the patient's satisfaction.     Katharine Look A Maryellen Dowdle

## 2018-02-01 NOTE — Transfer of Care (Signed)
Immediate Anesthesia Transfer of Care Note  Patient: Cathy Bryant  Procedure(s) Performed: ROBOTIC ASSISTED TOTAL HYSTERECTOMY WITH SALPINGECTOMY (Bilateral Abdomen)  Patient Location: PACU  Anesthesia Type:General  Level of Consciousness: sedated  Airway & Oxygen Therapy: Patient Spontanous Breathing and Patient connected to nasal cannula oxygen  Post-op Assessment: Report given to RN  Post vital signs: Reviewed and stable  Last Vitals:  Vitals:   02/01/18 1039  BP: 125/90  Pulse: 80  Resp: 18  Temp: 36.5 C  SpO2: 100%    Last Pain:  Vitals:   02/01/18 1039  TempSrc: Oral      Patients Stated Pain Goal: 2 (91/79/15 0569)  Complications: No apparent anesthesia complications

## 2018-02-02 ENCOUNTER — Encounter (HOSPITAL_COMMUNITY): Payer: Self-pay | Admitting: Obstetrics and Gynecology

## 2018-02-02 DIAGNOSIS — D252 Subserosal leiomyoma of uterus: Secondary | ICD-10-CM | POA: Diagnosis not present

## 2018-02-02 LAB — CBC
HEMATOCRIT: 35.5 % — AB (ref 36.0–46.0)
HEMOGLOBIN: 11.9 g/dL — AB (ref 12.0–15.0)
MCH: 29.2 pg (ref 26.0–34.0)
MCHC: 33.5 g/dL (ref 30.0–36.0)
MCV: 87.2 fL (ref 78.0–100.0)
Platelets: 285 10*3/uL (ref 150–400)
RBC: 4.07 MIL/uL (ref 3.87–5.11)
RDW: 13.1 % (ref 11.5–15.5)
WBC: 12.4 10*3/uL — ABNORMAL HIGH (ref 4.0–10.5)

## 2018-02-02 MED ORDER — IBUPROFEN 600 MG PO TABS: ORAL_TABLET | ORAL | 1 refills | Status: DC

## 2018-02-02 MED ORDER — OXYCODONE-ACETAMINOPHEN 5-325 MG PO TABS: ORAL_TABLET | ORAL | 0 refills | Status: DC

## 2018-02-02 NOTE — Progress Notes (Signed)
Pt given discharge instructions, recovering from surgery pamphlet, post-op care, follow-up and medications reviewed. Pt verbalizes understanding.

## 2018-02-02 NOTE — Progress Notes (Signed)
Cathy Bryant is a70 y.o.  [id number]  Post Op Date # 1:  Robot Assisted Total Laparoscopic Hysterectomy/Lysis of Adhesions/Bilateral Salpingectomy  Subjective: Patient is Doing well postoperatively. Patient has Pain is controlled with current analgesics. Medications being used: prescription NSAID's including Ketorolac 30 mg IV and narcotic analgesics including Percocet 5/325 mg. Patient has voided, had a BM,  is tolerating a regular diet and ambulating in the halls without any difficulty   Objective: Vital signs in last 24 hours: Temp:  [98.3 F (36.8 C)-99.4 F (37.4 C)] 98.7 F (37.1 C) (02/15 0824) Pulse Rate:  [69-89] 69 (02/15 0824) Resp:  [10-17] 16 (02/15 0824) BP: (100-132)/(49-82) 111/64 (02/15 0941) SpO2:  [97 %-100 %] 97 % (02/15 0824) Weight:  [290 lb (131.5 kg)] 290 lb (131.5 kg) (02/14 1800)  Intake/Output from previous day: 02/14 0701 - 02/15 0700 In: 2150 [I.V.:2150] Out: 790 [Urine:750] Intake/Output this shift: No intake/output data recorded. Recent Labs  Lab 01/31/18 0840 02/01/18 1658 02/02/18 0539  WBC 4.3 11.2* 12.4*  HGB  --  13.1 11.9*  HCT 40.9 39.3 35.5*  PLT 286 284 285     Recent Labs  Lab 01/31/18 0840 02/01/18 1658  NA 140  --   K 3.6  --   CL 104  --   CO2 28  --   BUN 13  --   CREATININE 0.88 0.86  CALCIUM 9.0  --   PROT 6.8  --   BILITOT 0.5  --   ALKPHOS 48  --   ALT 18  --   AST 21  --   GLUCOSE 100  --     EXAM: General: alert, cooperative and no distress Resp: clear to auscultation bilaterally Cardio: regular rate and rhythm, S1, S2 normal, no murmur, click, rub or gallop GI: Bowel sounds present with incisons intact and no evidence of infection. Extremities: Homans sign is negative, no sign of DVT and no calf tenderness. Vaginal Bleeding: none   Assessment: s/p Procedure(s): ROBOTIC ASSISTED TOTAL HYSTERECTOMY WITH SALPINGECTOMY: stable and progressing well  Plan: Discharge home  LOS: 0 days    Earnstine Regal, PA-C 02/02/2018 11:48 AM

## 2018-02-02 NOTE — Discharge Summary (Signed)
Physician Discharge Summary  Patient ID: Cathy Bryant MRN: [id number] DOB/AGE: 1971/05/13 47 y.o.  Admit date: 02/01/2018 Discharge date: 02/02/2018   Discharge Diagnoses: Menorrhagia, Symptomatic Uterine Fibroids, Dysmenorrhea, History of Anemia and History of Chronic Left Lower Extremity DVT Active Problems:   Menorrhagia   Operation: Robot Assisted Total Laparoscopic Hysterectomy, Lysis of Adhesions and Bilateral Salpingectomy  Discharged Condition: Good  Hospital Course: On the date of admission the patient underwent the aforementioned procedures and tolerated them well.  Post operative course was unremarkable with the patient resuming bowel and bladder function by post operative day #1 and was therefore deemed ready for discharge home.  Discharge hemoglobin was 11.9.  Disposition: 01-Home or Self Care  Discharge Medications:  Allergies as of 02/02/2018   No Known Allergies     Medication List    TAKE these medications   aspirin EC 81 MG tablet Take 81 mg by mouth daily.   fexofenadine-pseudoephedrine 180-240 MG 24 hr tablet Commonly known as:  ALLEGRA-D ALLERGY & CONGESTION Take 1 tablet by mouth daily.   fluticasone 50 MCG/ACT nasal spray Commonly known as:  FLONASE Place 2 sprays into both nostrils daily. What changed:    when to take this  reasons to take this   HYDROcodone-homatropine 5-1.5 MG/5ML syrup Commonly known as:  HYCODAN Take 5 mLs by mouth every 6 (six) hours as needed for cough.   ibuprofen 600 MG tablet Commonly known as:  ADVIL,MOTRIN 1 po  pc every 6 hours for 5 days then as needed for pain   lisinopril-hydrochlorothiazide 20-25 MG tablet Commonly known as:  PRINZIDE,ZESTORETIC Take 1 tablet by mouth daily.   loratadine 10 MG tablet Commonly known as:  CLARITIN Take 10 mg by mouth daily as needed for allergies.   oxyCODONE-acetaminophen 5-325 MG tablet Commonly known as:  PERCOCET/ROXICET 1  po every 6 hours as needed for post  operative pain   Vitamin D (Ergocalciferol) 50000 units Caps capsule Commonly known as:  DRISDOL Take 50,000 Units by mouth every 7 (seven) days.     Percocet 5/325 mg  1 po every 6 hours as needed for post operative pain Ibuprofen 600 mg 1 po  pc every 6 hours for 5 days then as needed for pain    Follow-up: Dr. Cletis Media on February 15, 2018 at 9:15 a.m.    Signed: Earnstine Regal, PA-C 02/02/2018, 7:35 AM

## 2018-02-02 NOTE — Anesthesia Postprocedure Evaluation (Signed)
Anesthesia Post Note  Patient: Melburn Popper  Procedure(s) Performed: ROBOTIC ASSISTED TOTAL HYSTERECTOMY WITH SALPINGECTOMY (Bilateral Abdomen)     Patient location during evaluation: [facility] Anesthesia Type: General Level of consciousness: awake and alert and oriented Pain management: satisfactory to patient Vital Signs Assessment: post-procedure vital signs reviewed and stable Respiratory status: spontaneous breathing and respiratory function stable Cardiovascular status: stable Postop Assessment: adequate PO intake Anesthetic complications: no    Last Vitals:  Vitals:   02/01/18 2339 02/02/18 0353  BP: (!) 111/49 (!) 100/49  Pulse: 84 89  Resp: 16 17  Temp: 37.3 C 37.4 C  SpO2: 98% 98%    Last Pain:  Vitals:   02/02/18 0532  TempSrc:   PainSc: Asleep   Pain Goal: Patients Stated Pain Goal: 2 (02/01/18 1900)               Katherina Mires

## 2018-02-06 ENCOUNTER — Other Ambulatory Visit (HOSPITAL_COMMUNITY): Payer: Self-pay | Admitting: Obstetrics and Gynecology

## 2018-02-06 DIAGNOSIS — R609 Edema, unspecified: Secondary | ICD-10-CM

## 2018-02-07 ENCOUNTER — Ambulatory Visit (HOSPITAL_COMMUNITY)
Admission: RE | Admit: 2018-02-07 | Discharge: 2018-02-07 | Disposition: A | Discharge status: Home / Self Care | Payer: BLUE CROSS/BLUE SHIELD | Source: Ambulatory Visit | Attending: Obstetrics and Gynecology | Admitting: Obstetrics and Gynecology

## 2018-02-07 DIAGNOSIS — R609 Edema, unspecified: Secondary | ICD-10-CM

## 2018-02-07 NOTE — Progress Notes (Signed)
*  Preliminary Results* Left lower extremity venous duplex completed. Left lower extremity is negative for acute deep vein thrombosis. There is no evidence of left Baker's cyst.  02/07/2018 11:46 AM  Maudry Mayhew, BS, RVT, RDCS, RDMS

## 2018-12-17 DIAGNOSIS — N6320 Unspecified lump in the left breast, unspecified quadrant: Secondary | ICD-10-CM | POA: Diagnosis not present

## 2018-12-20 DIAGNOSIS — N6320 Unspecified lump in the left breast, unspecified quadrant: Secondary | ICD-10-CM | POA: Diagnosis not present

## 2018-12-27 DIAGNOSIS — N6332 Unspecified lump in axillary tail of the left breast: Secondary | ICD-10-CM | POA: Diagnosis not present

## 2019-01-10 DIAGNOSIS — J209 Acute bronchitis, unspecified: Secondary | ICD-10-CM | POA: Diagnosis not present

## 2019-01-10 DIAGNOSIS — I10 Essential (primary) hypertension: Secondary | ICD-10-CM | POA: Diagnosis not present

## 2019-01-10 DIAGNOSIS — E7800 Pure hypercholesterolemia, unspecified: Secondary | ICD-10-CM | POA: Diagnosis not present

## 2019-01-10 DIAGNOSIS — J111 Influenza due to unidentified influenza virus with other respiratory manifestations: Secondary | ICD-10-CM | POA: Diagnosis not present

## 2019-01-17 DIAGNOSIS — G459 Transient cerebral ischemic attack, unspecified: Secondary | ICD-10-CM | POA: Diagnosis not present

## 2019-01-17 DIAGNOSIS — E7800 Pure hypercholesterolemia, unspecified: Secondary | ICD-10-CM | POA: Diagnosis not present

## 2019-01-17 DIAGNOSIS — I10 Essential (primary) hypertension: Secondary | ICD-10-CM | POA: Diagnosis not present

## 2019-02-11 DIAGNOSIS — R0600 Dyspnea, unspecified: Secondary | ICD-10-CM | POA: Diagnosis not present

## 2019-03-21 DIAGNOSIS — H5789 Other specified disorders of eye and adnexa: Secondary | ICD-10-CM | POA: Diagnosis not present

## 2019-06-10 DIAGNOSIS — Z6841 Body Mass Index (BMI) 40.0 and over, adult: Secondary | ICD-10-CM | POA: Diagnosis not present

## 2019-06-10 DIAGNOSIS — Z01419 Encounter for gynecological examination (general) (routine) without abnormal findings: Secondary | ICD-10-CM | POA: Diagnosis not present

## 2019-06-10 DIAGNOSIS — Z1231 Encounter for screening mammogram for malignant neoplasm of breast: Secondary | ICD-10-CM | POA: Diagnosis not present

## 2019-06-10 DIAGNOSIS — Z3040 Encounter for surveillance of contraceptives, unspecified: Secondary | ICD-10-CM | POA: Diagnosis not present

## 2019-07-05 ENCOUNTER — Other Ambulatory Visit: Payer: Self-pay

## 2019-07-05 DIAGNOSIS — Z20822 Contact with and (suspected) exposure to covid-19: Secondary | ICD-10-CM

## 2019-07-10 LAB — NOVEL CORONAVIRUS, NAA: SARS-CoV-2, NAA: NOT DETECTED

## 2019-09-02 ENCOUNTER — Other Ambulatory Visit: Payer: Self-pay | Admitting: Internal Medicine

## 2019-09-02 ENCOUNTER — Ambulatory Visit
Admission: RE | Admit: 2019-09-02 | Discharge: 2019-09-02 | Disposition: A | Discharge status: Home / Self Care | Payer: BLUE CROSS/BLUE SHIELD | Source: Ambulatory Visit | Attending: Internal Medicine | Admitting: Internal Medicine

## 2019-09-02 DIAGNOSIS — R109 Unspecified abdominal pain: Secondary | ICD-10-CM | POA: Diagnosis not present

## 2019-09-02 DIAGNOSIS — E559 Vitamin D deficiency, unspecified: Secondary | ICD-10-CM | POA: Diagnosis not present

## 2019-09-02 DIAGNOSIS — K5900 Constipation, unspecified: Secondary | ICD-10-CM | POA: Diagnosis not present

## 2019-09-13 DIAGNOSIS — Z23 Encounter for immunization: Secondary | ICD-10-CM | POA: Diagnosis not present

## 2019-09-13 DIAGNOSIS — R195 Other fecal abnormalities: Secondary | ICD-10-CM | POA: Diagnosis not present

## 2019-09-13 DIAGNOSIS — R109 Unspecified abdominal pain: Secondary | ICD-10-CM | POA: Diagnosis not present

## 2019-09-20 DIAGNOSIS — E7800 Pure hypercholesterolemia, unspecified: Secondary | ICD-10-CM | POA: Diagnosis not present

## 2019-09-20 DIAGNOSIS — Z0000 Encounter for general adult medical examination without abnormal findings: Secondary | ICD-10-CM | POA: Diagnosis not present

## 2019-12-25 DIAGNOSIS — H9209 Otalgia, unspecified ear: Secondary | ICD-10-CM | POA: Diagnosis not present

## 2019-12-25 DIAGNOSIS — G4710 Hypersomnia, unspecified: Secondary | ICD-10-CM | POA: Diagnosis not present

## 2019-12-25 DIAGNOSIS — R0683 Snoring: Secondary | ICD-10-CM | POA: Diagnosis not present

## 2019-12-25 DIAGNOSIS — R519 Headache, unspecified: Secondary | ICD-10-CM | POA: Diagnosis not present

## 2020-02-07 ENCOUNTER — Ambulatory Visit
Admission: RE | Admit: 2020-02-07 | Discharge: 2020-02-07 | Disposition: A | Discharge status: Home / Self Care | Payer: BC Managed Care – PPO | Source: Ambulatory Visit | Attending: Internal Medicine | Admitting: Internal Medicine

## 2020-02-07 ENCOUNTER — Other Ambulatory Visit: Payer: Self-pay | Admitting: Internal Medicine

## 2020-02-07 DIAGNOSIS — M79662 Pain in left lower leg: Secondary | ICD-10-CM

## 2020-02-07 DIAGNOSIS — I82532 Chronic embolism and thrombosis of left popliteal vein: Secondary | ICD-10-CM | POA: Diagnosis not present

## 2020-02-07 DIAGNOSIS — I82432 Acute embolism and thrombosis of left popliteal vein: Secondary | ICD-10-CM | POA: Diagnosis not present

## 2020-02-21 ENCOUNTER — Other Ambulatory Visit: Payer: Self-pay

## 2020-02-21 ENCOUNTER — Ambulatory Visit: Payer: BC Managed Care – PPO | Attending: Internal Medicine

## 2020-02-21 DIAGNOSIS — Z23 Encounter for immunization: Secondary | ICD-10-CM | POA: Insufficient documentation

## 2020-02-21 NOTE — Progress Notes (Signed)
   Covid-19 Vaccination Clinic  Name:  Cathy Bryant    MRN: [id number] DOB: 05-01-1971  02/21/2020  Cathy Bryant was observed post Covid-19 immunization for 15 minutes without incident. She was provided with Vaccine Information Sheet and instruction to access the V-Safe system.   Cathy Bryant was instructed to call 911 with any severe reactions post vaccine: Marland Kitchen Difficulty breathing  . Swelling of face and throat  . A fast heartbeat  . A bad rash all over body  . Dizziness and weakness

## 2020-03-23 ENCOUNTER — Ambulatory Visit: Payer: BC Managed Care – PPO

## 2020-03-25 ENCOUNTER — Ambulatory Visit: Payer: BC Managed Care – PPO | Attending: Internal Medicine

## 2020-03-25 DIAGNOSIS — Z23 Encounter for immunization: Secondary | ICD-10-CM

## 2020-03-25 NOTE — Progress Notes (Signed)
   Covid-19 Vaccination Clinic  Name:  Cathy Bryant    MRN: [id number] DOB: 11/07/1971  03/25/2020  [name] was observed post Covid-19 immunization for 15 minutes without incident. She was provided with Vaccine Information Sheet and instruction to access the V-Safe system.   [name] was instructed to call 911 with any severe reactions post vaccine: Marland Kitchen Difficulty breathing  . Swelling of face and throat  . A fast heartbeat  . A bad rash all over body  . Dizziness and weakness   Immunizations Administered    Name Date Dose VIS Date Route   Pfizer COVID-19 Vaccine 03/25/2020 11:36 AM 0.3 mL 11/29/2019 Intramuscular   Manufacturer: [employer], [employer]   Lot: [id number]   [place]: [id number]

## 2020-04-27 DIAGNOSIS — L918 Other hypertrophic disorders of the skin: Secondary | ICD-10-CM | POA: Diagnosis not present

## 2020-05-20 DIAGNOSIS — N644 Mastodynia: Secondary | ICD-10-CM | POA: Diagnosis not present

## 2020-06-10 DIAGNOSIS — Z6841 Body Mass Index (BMI) 40.0 and over, adult: Secondary | ICD-10-CM | POA: Diagnosis not present

## 2020-06-10 DIAGNOSIS — Z1231 Encounter for screening mammogram for malignant neoplasm of breast: Secondary | ICD-10-CM | POA: Diagnosis not present

## 2020-06-10 DIAGNOSIS — Z01419 Encounter for gynecological examination (general) (routine) without abnormal findings: Secondary | ICD-10-CM | POA: Diagnosis not present

## 2020-06-10 DIAGNOSIS — Z1211 Encounter for screening for malignant neoplasm of colon: Secondary | ICD-10-CM | POA: Diagnosis not present

## 2020-06-25 ENCOUNTER — Other Ambulatory Visit: Payer: Self-pay | Admitting: Obstetrics and Gynecology

## 2020-06-25 DIAGNOSIS — Z1231 Encounter for screening mammogram for malignant neoplasm of breast: Secondary | ICD-10-CM

## 2020-07-15 DIAGNOSIS — J392 Other diseases of pharynx: Secondary | ICD-10-CM | POA: Diagnosis not present

## 2020-07-15 DIAGNOSIS — R062 Wheezing: Secondary | ICD-10-CM | POA: Diagnosis not present

## 2020-10-09 DIAGNOSIS — Z23 Encounter for immunization: Secondary | ICD-10-CM | POA: Diagnosis not present

## 2020-10-09 DIAGNOSIS — E7800 Pure hypercholesterolemia, unspecified: Secondary | ICD-10-CM | POA: Diagnosis not present

## 2020-10-09 DIAGNOSIS — I10 Essential (primary) hypertension: Secondary | ICD-10-CM | POA: Diagnosis not present

## 2020-10-09 DIAGNOSIS — Z0000 Encounter for general adult medical examination without abnormal findings: Secondary | ICD-10-CM | POA: Diagnosis not present

## 2020-12-17 ENCOUNTER — Other Ambulatory Visit: Payer: BC Managed Care – PPO

## 2020-12-17 DIAGNOSIS — Z20822 Contact with and (suspected) exposure to covid-19: Secondary | ICD-10-CM

## 2020-12-19 LAB — SARS-COV-2, NAA 2 DAY TAT

## 2020-12-19 LAB — NOVEL CORONAVIRUS, NAA: SARS-CoV-2, NAA: NOT DETECTED

## 2021-08-04 ENCOUNTER — Other Ambulatory Visit: Payer: Self-pay | Admitting: Internal Medicine

## 2021-08-04 DIAGNOSIS — R200 Anesthesia of skin: Secondary | ICD-10-CM

## 2021-08-07 ENCOUNTER — Encounter: Payer: Self-pay | Admitting: Hematology

## 2021-08-14 ENCOUNTER — Inpatient Hospital Stay: Admission: RE | Admit: 2021-08-14 | Payer: BC Managed Care – PPO | Source: Ambulatory Visit

## 2021-08-20 ENCOUNTER — Other Ambulatory Visit: Payer: Self-pay | Admitting: Internal Medicine

## 2021-08-25 ENCOUNTER — Other Ambulatory Visit: Payer: Self-pay | Admitting: Internal Medicine

## 2021-08-25 DIAGNOSIS — R519 Headache, unspecified: Secondary | ICD-10-CM

## 2021-09-03 ENCOUNTER — Other Ambulatory Visit: Payer: Self-pay

## 2021-10-04 ENCOUNTER — Encounter: Payer: Self-pay | Admitting: Hematology

## 2021-10-05 ENCOUNTER — Encounter: Payer: Self-pay | Admitting: Neurology

## 2021-10-05 ENCOUNTER — Ambulatory Visit (INDEPENDENT_AMBULATORY_CARE_PROVIDER_SITE_OTHER): Payer: Managed Care, Other (non HMO) | Admitting: Neurology

## 2021-10-05 ENCOUNTER — Other Ambulatory Visit: Payer: Self-pay

## 2021-10-05 VITALS — BP 134/86 | HR 72 | Ht 65.0 in | Wt 302.0 lb

## 2021-10-05 DIAGNOSIS — R0683 Snoring: Secondary | ICD-10-CM | POA: Diagnosis not present

## 2021-10-05 DIAGNOSIS — R202 Paresthesia of skin: Secondary | ICD-10-CM

## 2021-10-05 MED ORDER — GABAPENTIN 100 MG PO CAPS: 100.0000 mg | ORAL_CAPSULE | Freq: Every day | ORAL | 6 refills | Status: DC

## 2021-10-05 NOTE — Progress Notes (Signed)
Chief Complaint  Patient presents with   New Patient (Initial Visit)    RM 16, alone. In July, felt a pop while chewing. Mouth started to feel funny. Felt tingling and some numbness R side of mouth. Is on gabapentin which may be helping some.   PCP: Dr. Delfina Redwood. Referring: Dr. Tamela Oddi      ASSESSMENT AND PLAN  Cathy Bryant is a 50 y.o. female   Right facial paresthesia, reportedly involving V2 V3 distribution  Showed variable effort on examination, split midline at the forehead, chin on 24th examinations  Otherwise normal neurological examination  normal MRI of the brain with without contrast Continue gabapentin 100 mg every night as needed  Narrow oral pharyngeal space, frequent snoring  High risk for obstructive sleep apnea,  Refer to sleep study   DIAGNOSTIC DATA (LABS, IMAGING, TESTING) - I reviewed patient records, labs, notes, testing and imaging myself where available.   MEDICAL HISTORY:  Cathy Bryant is a 50 year old female, seen in request by dentist Tamela Oddi, [place] and her primary care physician Dr. Luz Brazen, Jori Moll, elevation of right facial paresthesia  I reviewed and summarized the referring note. PMHx. HTN HLD. Obesity  On December 23, 2020, while she was trying chips and salsa on her right side, she felt a pop, mild right cheek area discomfort, but she was able to finish her lunch without any difficulty  Later that day, few hours later, she felt numbness and tingling of her right lateral tongue, right cheek area, involving upper and lower teeth, stop at the midline, she denies right forehead area paresthesia, no right arm and leg involvement, no weakness, no dysarthria, she denies significant pain, no limitation in her activity  Over the past few months, she was seen by general dentist, oral surgeon, primary care physician, there was no pathology found  MRI of the brain with and without contrast September 02, 2021 from [facility] was normal  She was given gabapentin 100 mg every night since beginning of October 2022, which has been helpful  She does have some snoring, awakening in the middle of the sleep, was noted to have significant narrow oropharyngeal space on today's examination, she does have daytime sleepiness and fatigue sometimes  PHYSICAL EXAM:   Vitals:   10/05/21 1330  BP: 134/86  Pulse: 72  Weight: (!) 302 lb (137 kg)  Height: 5\' 5"  (1.651 m)   Not recorded     Body mass index is 50.26 kg/m.  PHYSICAL EXAMNIATION:  Gen: NAD, conversant, well nourised, well groomed                     Cardiovascular: Regular rate rhythm, no peripheral edema, warm, nontender. Eyes: Conjunctivae clear without exudates or hemorrhage Neck: Supple, no carotid bruits. Pulmonary: Clear to auscultation bilaterally   NEUROLOGICAL EXAM:  MENTAL STATUS: Speech:    Speech is normal; fluent and spontaneous with normal comprehension.  Cognition:     Orientation to time, place and person     Normal recent and remote memory     Normal Attention span and concentration     Normal Language, naming, repeating,spontaneous speech     Fund of knowledge   CRANIAL NERVES: CN II: Visual fields are full to confrontation. Pupils are round equal and briskly reactive to light. CN III, IV, VI: extraocular movement are normal. No ptosis. CN V: Facial sensation is intact to light touch CN VII: Face is symmetric with normal eye closure  CN VIII: Hearing is normal to causal conversation. CN IX, X: Phonation is normal. CN XI: Head turning and shoulder shrug are intact  MOTOR: There is no pronator drift of out-stretched arms. Muscle bulk and tone are normal. Muscle strength is normal.  REFLEXES: Reflexes are 2+ and symmetric at the biceps, triceps, knees, and ankles. Plantar responses are flexor.  SENSORY: Intact to light touch, pinprick and vibratory sensation are intact in fingers and toes.  COORDINATION: There is  no trunk or limb dysmetria noted.  GAIT/STANCE: Posture is normal. Gait is steady with normal steps, base, arm swing, and turning. Heel and toe walking are normal. Tandem gait is normal.  Romberg is absent.  REVIEW OF SYSTEMS:  Full 14 system review of systems performed and notable only for as above All other review of systems were negative.   ALLERGIES: No Known Allergies  HOME MEDICATIONS: Current Outpatient Medications  Medication Sig Dispense Refill   aspirin EC 81 MG tablet Take 81 mg by mouth daily.     fluticasone (FLONASE) 50 MCG/ACT nasal spray Place 2 sprays into both nostrils daily. (Patient taking differently: Place 2 sprays into both nostrils daily as needed for allergies.) 16 g 0   gabapentin (NEURONTIN) 100 MG capsule Take 100 mg by mouth at bedtime.     lisinopril-hydrochlorothiazide (PRINZIDE,ZESTORETIC) 20-25 MG tablet Take 1 tablet by mouth daily.     loratadine (CLARITIN) 10 MG tablet Take 10 mg by mouth daily as needed for allergies.     pravastatin (PRAVACHOL) 20 MG tablet Take 20 mg by mouth daily.     No current facility-administered medications for this visit.    PAST MEDICAL HISTORY: Past Medical History:  Diagnosis Date   Anemia    Hypertension    Seasonal allergies     PAST SURGICAL HISTORY: Past Surgical History:  Procedure Laterality Date   attempted ablation     in MD's Office   CESAREAN SECTION  2011   [place]     In MD's office   ROBOTIC ASSISTED TOTAL HYSTERECTOMY WITH SALPINGECTOMY Bilateral 02/01/2018   Procedure: ROBOTIC ASSISTED TOTAL HYSTERECTOMY WITH SALPINGECTOMY;  Surgeon: Delsa Bern, MD;  Location: [facility];  Service: Gynecology;  Laterality: Bilateral;   TONSILLECTOMY     WISDOM TOOTH EXTRACTION      FAMILY HISTORY: No family history on file.  SOCIAL HISTORY: Social History   Socioeconomic History   Marital status: Single    Spouse name: Not on file   Number of children:  Not on file   Years of education: Not on file   Highest education level: Not on file  Occupational History   Not on file  Tobacco Use   Smoking status: Never   Smokeless tobacco: Never  Vaping Use   Vaping Use: Never used  Substance and Sexual Activity   Alcohol use: Yes    Comment: occasional   Drug use: No   Sexual activity: Never    Birth control/protection: None  Other Topics Concern   Not on file  Social History Narrative   Not on file   Social Determinants of Health   Financial Resource Strain: Not on file  Food Insecurity: Not on file  Transportation Needs: Not on file  Physical Activity: Not on file  Stress: Not on file  Social Connections: Not on file  Intimate Partner Violence: Not on file      Marcial Pacas, M.D. Ph.D.  [facility]  [address] [place], [place] [address] Ph: [phone] Fax: [phone]  CC:  Tamela Oddi, [address],  [place] [id number]  Seward Carol, MD

## 2021-10-15 IMAGING — US US EXTREM LOW VENOUS*L*
1 series · 13 of 24 positions shown · non-contrast
Comparison: Report from a prior study performed at the [HOSPITAL]
[REDACTED] on 02/07/2018

CLINICAL DATA: Left calf pain and history of prior DVT in 6567.



[Series 1: us extrem low venous*left* · 0.08mm/px · 13 of 35 slices shown]
[im 1/35]
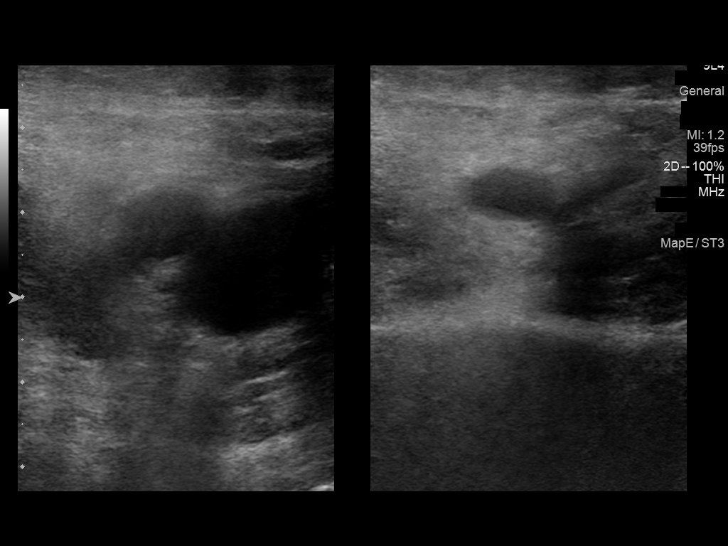
[im 3/35]
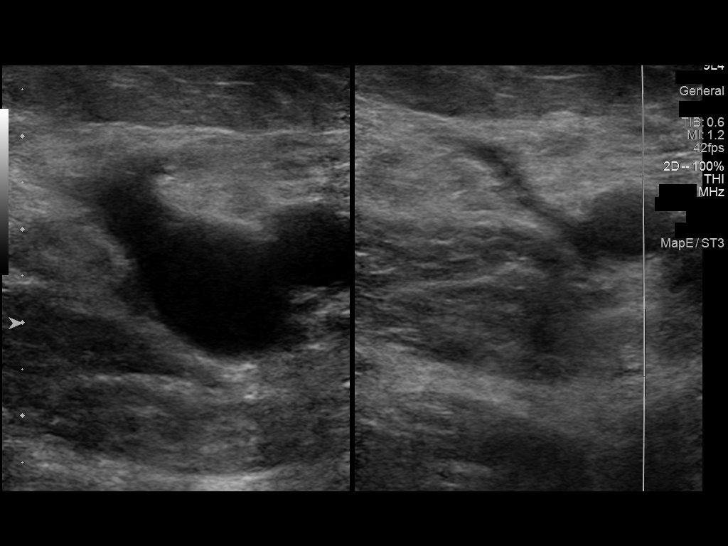
[im 6/35]
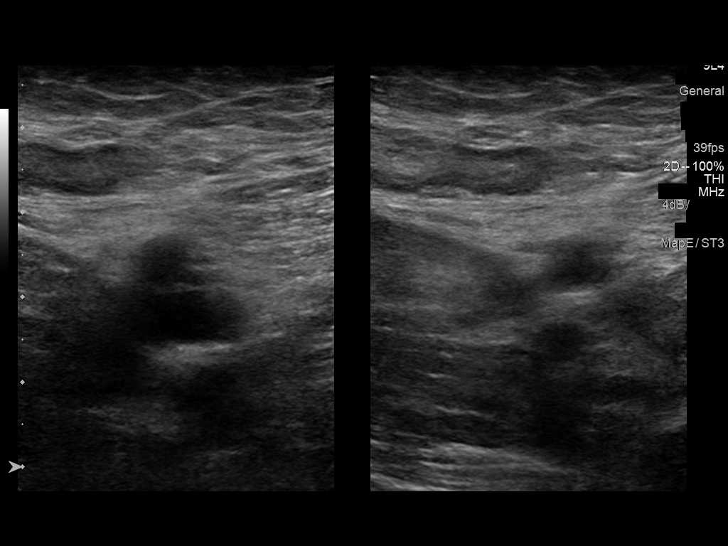
[im 9/35]
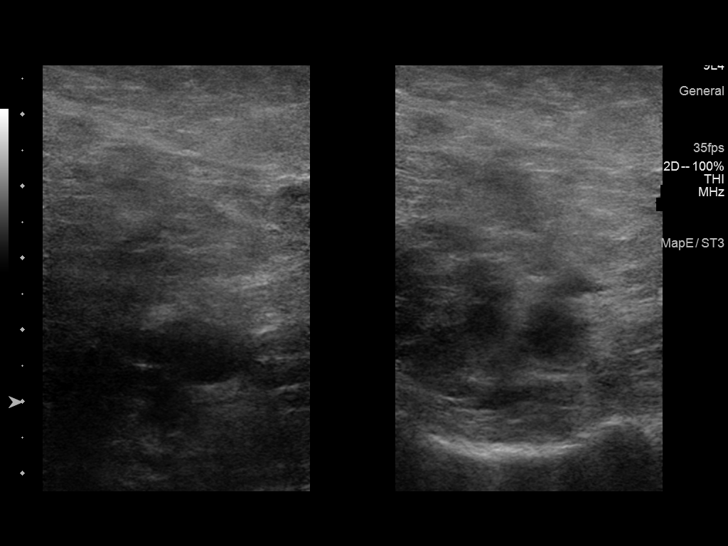
[im 12/35]
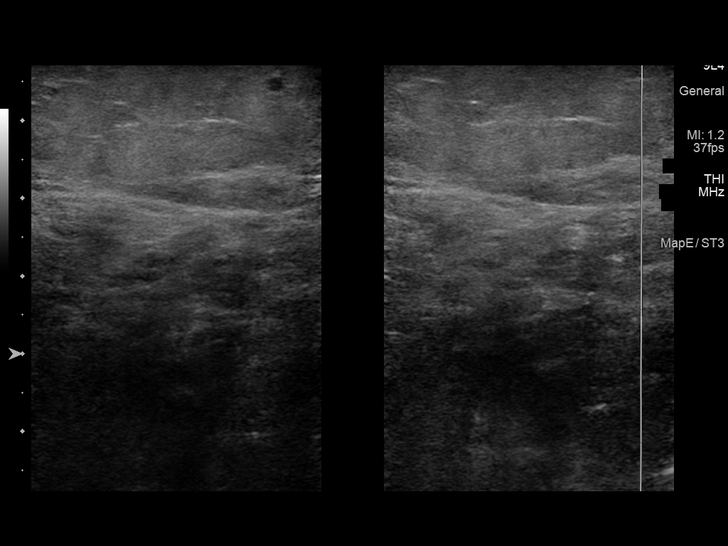
[im 15/35]
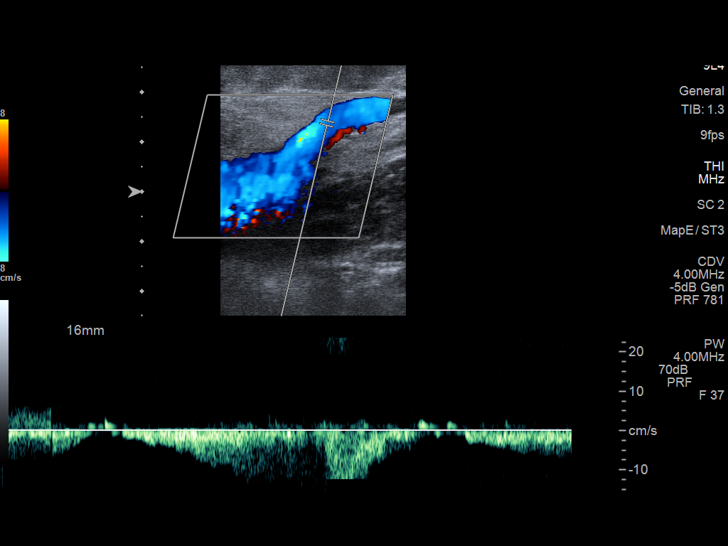
[im 18/35]
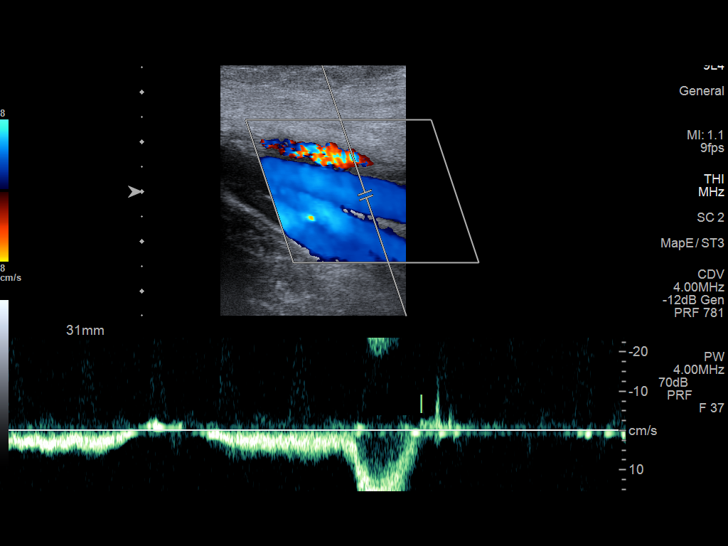
[im 20/35]
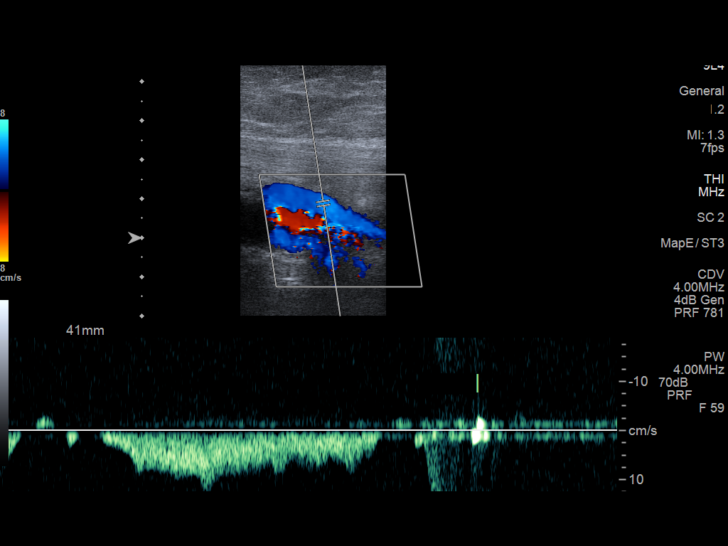
[im 23/35]
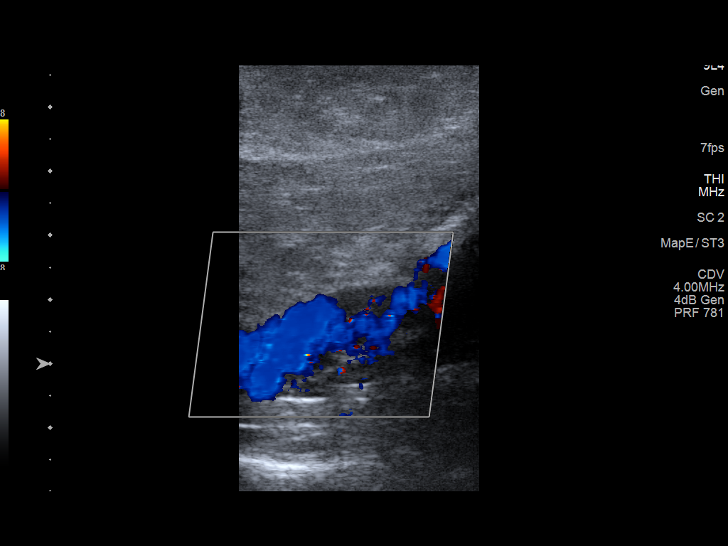
[im 26/35]
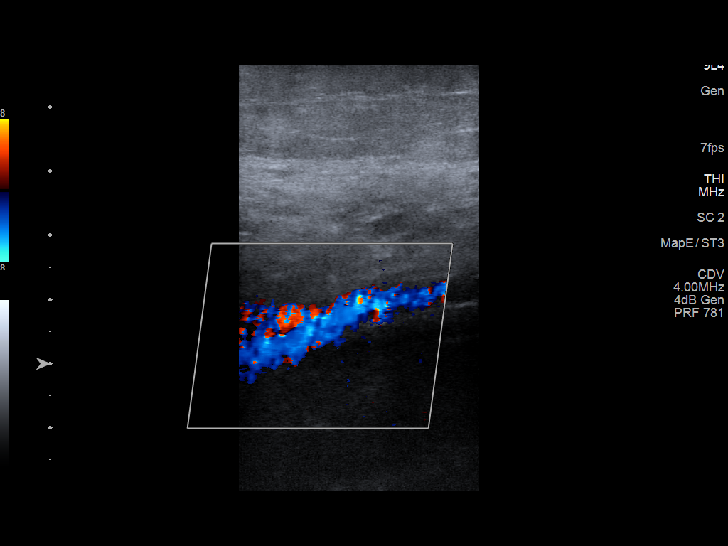
[im 29/35]
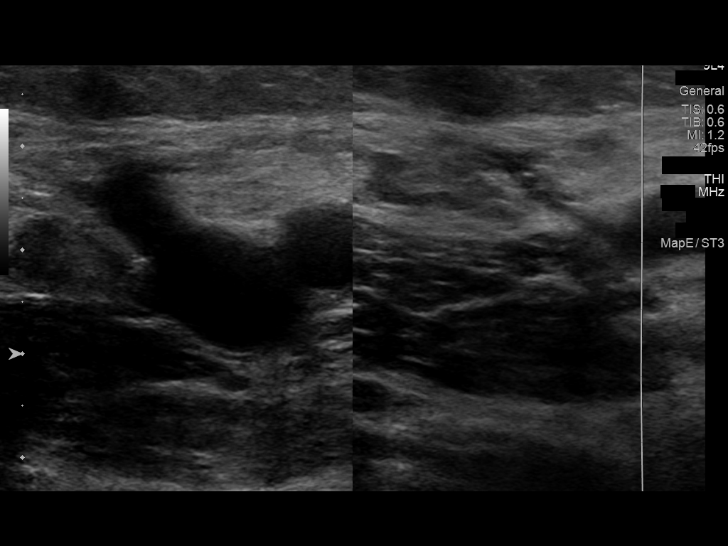
[im 32/35]
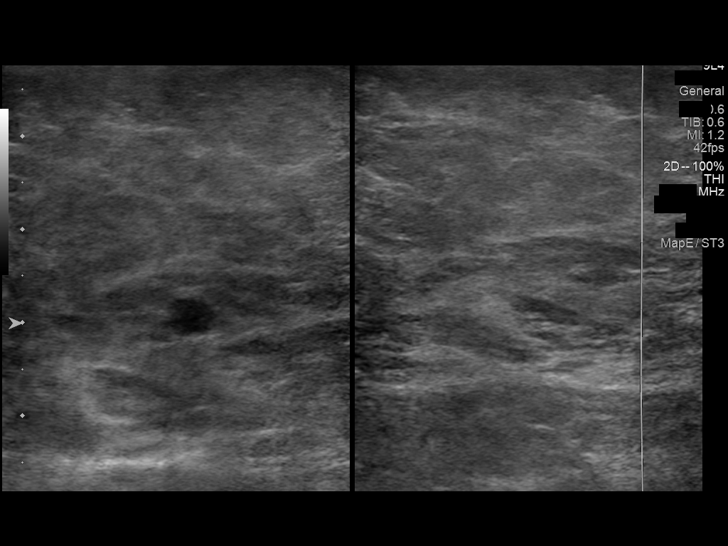
[im 35/35]
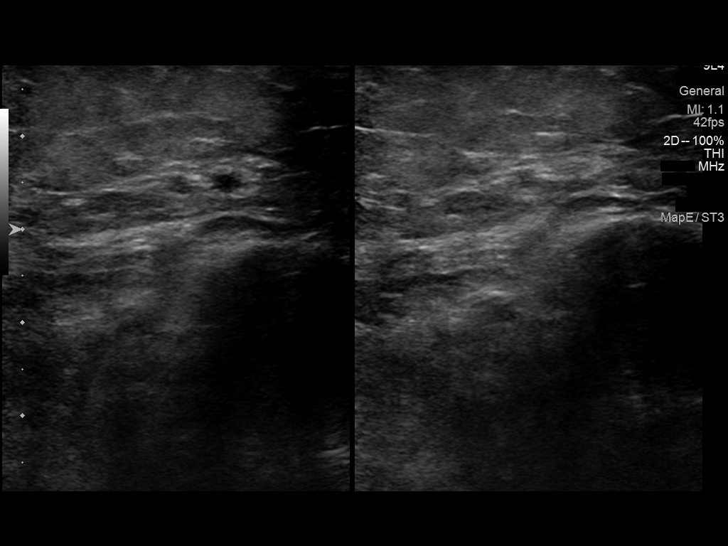

[13 of 24 positions shown; findings below may reference images not displayed]

FINDINGS: Contralateral Common Femoral Vein: Respiratory phasicity is normal
and symmetric with the symptomatic side. No evidence of thrombus.
Normal compressibility.

Common Femoral Vein: No evidence of thrombus. Normal
compressibility, respiratory phasicity and response to augmentation.

Saphenofemoral Junction: No evidence of thrombus. Normal
compressibility and flow on color Doppler imaging.

Profunda Femoral Vein: No evidence of thrombus. Normal
compressibility and flow on color Doppler imaging.

Femoral Vein: No evidence of thrombus. Normal compressibility,
respiratory phasicity and response to augmentation.

Popliteal Vein: Echogenic chronic thrombus identified in the
popliteal vein with some flow present around an echogenic web. The
vein is partially compressible. Findings are consistent with chronic
thrombus and similar to described findings on the 3123 study.

Calf Veins: No evidence of thrombus. Normal compressibility and flow
on color Doppler imaging.

Superficial Great Saphenous Vein: No evidence of thrombus. Normal
compressibility.

Venous Reflux:  None.

Other Findings: No evidence of superficial thrombophlebitis or
abnormal fluid collection.
IMPRESSION: Chronic appearing nonocclusive thrombus in the left popliteal vein.
Findings are similar to will was described on a prior study in 3123.
No evidence of acute appearing deep venous thrombus.

## 2022-09-26 ENCOUNTER — Other Ambulatory Visit (HOSPITAL_COMMUNITY): Payer: Self-pay

## 2022-09-26 ENCOUNTER — Encounter: Payer: Self-pay | Admitting: Hematology

## 2022-09-26 MED ORDER — SAXENDA 18 MG/3ML ~~LOC~~ SOPN
PEN_INJECTOR | SUBCUTANEOUS | 0 refills | Status: DC
  Filled 2022-09-26: qty 15, 30d supply, fill #0

## 2022-09-29 ENCOUNTER — Other Ambulatory Visit (HOSPITAL_COMMUNITY): Payer: Self-pay

## 2022-10-03 ENCOUNTER — Other Ambulatory Visit (HOSPITAL_COMMUNITY): Payer: Self-pay

## 2022-10-12 ENCOUNTER — Other Ambulatory Visit (HOSPITAL_COMMUNITY): Payer: Self-pay

## 2022-10-12 MED ORDER — SAXENDA 18 MG/3ML ~~LOC~~ SOPN
3.0000 mg | PEN_INJECTOR | Freq: Every day | SUBCUTANEOUS | 0 refills | Status: DC
  Filled 2022-10-12 – 2022-11-15 (×2): qty 15, 30d supply, fill #0

## 2022-11-15 ENCOUNTER — Other Ambulatory Visit (HOSPITAL_COMMUNITY): Payer: Self-pay

## 2022-12-07 ENCOUNTER — Other Ambulatory Visit: Payer: Self-pay | Admitting: Obstetrics and Gynecology

## 2022-12-07 DIAGNOSIS — N644 Mastodynia: Secondary | ICD-10-CM

## 2022-12-22 ENCOUNTER — Encounter: Payer: Self-pay | Admitting: Hematology

## 2022-12-22 ENCOUNTER — Ambulatory Visit: Admission: RE | Admit: 2022-12-22 | Payer: Self-pay | Source: Ambulatory Visit

## 2022-12-22 ENCOUNTER — Ambulatory Visit
Admission: RE | Admit: 2022-12-22 | Discharge: 2022-12-22 | Disposition: A | Discharge status: Home / Self Care | Payer: Managed Care, Other (non HMO) | Source: Ambulatory Visit | Attending: Obstetrics and Gynecology | Admitting: Obstetrics and Gynecology

## 2022-12-22 DIAGNOSIS — N644 Mastodynia: Secondary | ICD-10-CM

## 2023-02-23 DIAGNOSIS — M549 Dorsalgia, unspecified: Secondary | ICD-10-CM | POA: Diagnosis not present

## 2023-05-04 DIAGNOSIS — R252 Cramp and spasm: Secondary | ICD-10-CM | POA: Diagnosis not present

## 2023-05-04 DIAGNOSIS — R42 Dizziness and giddiness: Secondary | ICD-10-CM | POA: Diagnosis not present

## 2023-05-04 DIAGNOSIS — G479 Sleep disorder, unspecified: Secondary | ICD-10-CM | POA: Diagnosis not present

## 2023-05-04 DIAGNOSIS — G4733 Obstructive sleep apnea (adult) (pediatric): Secondary | ICD-10-CM | POA: Diagnosis not present

## 2023-05-10 DIAGNOSIS — L658 Other specified nonscarring hair loss: Secondary | ICD-10-CM | POA: Diagnosis not present

## 2023-05-10 DIAGNOSIS — L668 Other cicatricial alopecia: Secondary | ICD-10-CM | POA: Diagnosis not present

## 2023-05-10 DIAGNOSIS — L659 Nonscarring hair loss, unspecified: Secondary | ICD-10-CM | POA: Diagnosis not present

## 2023-05-10 DIAGNOSIS — D485 Neoplasm of uncertain behavior of skin: Secondary | ICD-10-CM | POA: Diagnosis not present

## 2023-05-10 DIAGNOSIS — L218 Other seborrheic dermatitis: Secondary | ICD-10-CM | POA: Diagnosis not present

## 2023-05-18 DIAGNOSIS — R55 Syncope and collapse: Secondary | ICD-10-CM | POA: Diagnosis not present

## 2023-05-18 DIAGNOSIS — R748 Abnormal levels of other serum enzymes: Secondary | ICD-10-CM | POA: Diagnosis not present

## 2023-05-18 DIAGNOSIS — R42 Dizziness and giddiness: Secondary | ICD-10-CM | POA: Diagnosis not present

## 2023-07-24 DIAGNOSIS — Z01419 Encounter for gynecological examination (general) (routine) without abnormal findings: Secondary | ICD-10-CM | POA: Diagnosis not present

## 2023-07-24 DIAGNOSIS — Z1231 Encounter for screening mammogram for malignant neoplasm of breast: Secondary | ICD-10-CM | POA: Diagnosis not present

## 2023-07-24 DIAGNOSIS — Z139 Encounter for screening, unspecified: Secondary | ICD-10-CM | POA: Diagnosis not present

## 2023-08-14 ENCOUNTER — Encounter: Payer: Self-pay | Admitting: Hematology

## 2023-08-14 ENCOUNTER — Other Ambulatory Visit (HOSPITAL_BASED_OUTPATIENT_CLINIC_OR_DEPARTMENT_OTHER): Payer: Self-pay

## 2023-08-14 MED ORDER — SEMAGLUTIDE-WEIGHT MANAGEMENT 1 MG/0.5ML ~~LOC~~ SOAJ
1.0000 mg | SUBCUTANEOUS | 0 refills | Status: DC
  Filled 2023-08-14: qty 2, 28d supply, fill #0

## 2023-08-29 DIAGNOSIS — U071 COVID-19: Secondary | ICD-10-CM | POA: Diagnosis not present

## 2023-08-29 DIAGNOSIS — J069 Acute upper respiratory infection, unspecified: Secondary | ICD-10-CM | POA: Diagnosis not present

## 2023-09-12 DIAGNOSIS — Z1211 Encounter for screening for malignant neoplasm of colon: Secondary | ICD-10-CM | POA: Diagnosis not present

## 2023-09-12 DIAGNOSIS — K5904 Chronic idiopathic constipation: Secondary | ICD-10-CM | POA: Diagnosis not present

## 2023-09-12 DIAGNOSIS — K219 Gastro-esophageal reflux disease without esophagitis: Secondary | ICD-10-CM | POA: Diagnosis not present

## 2023-10-23 DIAGNOSIS — K562 Volvulus: Secondary | ICD-10-CM | POA: Diagnosis not present

## 2023-10-23 DIAGNOSIS — Z1211 Encounter for screening for malignant neoplasm of colon: Secondary | ICD-10-CM | POA: Diagnosis not present

## 2023-10-25 DIAGNOSIS — M7989 Other specified soft tissue disorders: Secondary | ICD-10-CM | POA: Diagnosis not present

## 2023-11-20 DIAGNOSIS — G4733 Obstructive sleep apnea (adult) (pediatric): Secondary | ICD-10-CM | POA: Diagnosis not present

## 2023-11-20 DIAGNOSIS — Z0000 Encounter for general adult medical examination without abnormal findings: Secondary | ICD-10-CM | POA: Diagnosis not present

## 2023-11-20 DIAGNOSIS — I10 Essential (primary) hypertension: Secondary | ICD-10-CM | POA: Diagnosis not present

## 2023-12-12 DIAGNOSIS — R519 Headache, unspecified: Secondary | ICD-10-CM | POA: Diagnosis not present

## 2024-01-18 ENCOUNTER — Ambulatory Visit (HOSPITAL_COMMUNITY)
Admission: EM | Admit: 2024-01-18 | Discharge: 2024-01-18 | Disposition: A | Discharge status: Home / Self Care | Payer: BC Managed Care – PPO | Attending: Emergency Medicine | Admitting: Emergency Medicine

## 2024-01-18 ENCOUNTER — Encounter: Payer: Self-pay | Admitting: Hematology

## 2024-01-18 ENCOUNTER — Encounter (HOSPITAL_COMMUNITY): Payer: Self-pay

## 2024-01-18 DIAGNOSIS — H11433 Conjunctival hyperemia, bilateral: Secondary | ICD-10-CM | POA: Diagnosis not present

## 2024-01-18 MED ORDER — CETIRIZINE HCL 10 MG PO TABS: 10.0000 mg | ORAL_TABLET | Freq: Every day | ORAL | 2 refills | Status: AC

## 2024-01-18 MED ORDER — OLOPATADINE HCL 0.1 % OP SOLN: 1.0000 [drp] | Freq: Two times a day (BID) | OPHTHALMIC | 0 refills | Status: AC

## 2024-01-18 NOTE — ED Triage Notes (Signed)
Bilateral Eye irritation x 2 weeks. Patient states she bought a new shampoo and it had [place] black caster oil, tea tree oil, and shea butter. This got in her eye and burned. Having ongoing discomfort and slight blurred vision.   Could not get in to see an ophthalmologist. Patient tried otc eye drops from [facility] with no relief.

## 2024-01-18 NOTE — ED Provider Notes (Signed)
[facility]    CSN: [id number] Arrival date & time: 01/18/24  1521      History   Chief Complaint Chief Complaint  Patient presents with   Eye Problem    HPI Cathy Bryant is a 53 y.o. female.  Almost 2 weeks of bilateral eye irritation, redness Started after using a new shampoo that got into her eyes Reports discomfort, not necessarily pain No discharge. Not having vision changes. No pain with eye movement, no photophobia  Tried OTC eye drops  Past Medical History:  Diagnosis Date   Anemia    Hypertension    Seasonal allergies     Patient Active Problem List   Diagnosis Date Noted   Facial paresthesia 10/05/2021   Snoring 10/05/2021   Menorrhagia 02/01/2018   Iron deficiency anemia due to chronic blood loss 07/18/2017   Essential hypertension 01/20/2017   Hyperlipidemia 01/20/2017   Morbid obesity (HCC) 01/20/2017    Past Surgical History:  Procedure Laterality Date   attempted ablation     in MD's Office   CESAREAN SECTION  2011   COLONOSCOPY     DILATION AND CURETTAGE OF UTERUS     In MD's office   ROBOTIC ASSISTED TOTAL HYSTERECTOMY WITH SALPINGECTOMY Bilateral 02/01/2018   Procedure: ROBOTIC ASSISTED TOTAL HYSTERECTOMY WITH SALPINGECTOMY;  Surgeon: Silverio Lay, MD;  Location: [facility];  Service: Gynecology;  Laterality: Bilateral;   TONSILLECTOMY     WISDOM TOOTH EXTRACTION      OB History   No obstetric history on file.      Home Medications    Prior to Admission medications   Medication Sig Start Date End Date Taking? Authorizing Provider  cetirizine (ZYRTEC ALLERGY) 10 MG tablet Take 1 tablet (10 mg total) by mouth daily. 01/18/24  Yes Braian Tijerina, Lurena Joiner, PA-C  olopatadine (PATANOL) 0.1 % ophthalmic solution Place 1 drop into both eyes 2 (two) times daily. 01/18/24  Yes Leor Whyte, Ray Church  aspirin EC 81 MG tablet Take 81 mg by mouth daily.   Yes [provider]  fluticasone (FLONASE) 50 MCG/ACT nasal spray Place 2  sprays into both nostrils daily. Patient taking differently: Place 2 sprays into both nostrils daily as needed for allergies. 08/17/17  Yes Hassan Rowan, MD  lisinopril-hydrochlorothiazide (PRINZIDE,ZESTORETIC) 20-25 MG tablet Take 1 tablet by mouth daily.   Yes [provider]  loratadine (CLARITIN) 10 MG tablet Take 10 mg by mouth daily as needed for allergies.   Yes [provider]  pravastatin (PRAVACHOL) 20 MG tablet Take 20 mg by mouth daily.   Yes [provider]    Family History History reviewed. No pertinent family history.  Social History Social History   Tobacco Use   Smoking status: Never   Smokeless tobacco: Never  Vaping Use   Vaping status: Never Used  Substance Use Topics   Alcohol use: Yes    Comment: occasional   Drug use: No     Allergies   Patient has no known allergies.   Review of Systems Review of Systems Per HPI  Physical Exam Triage Vital Signs ED Triage Vitals  Encounter Vitals Group     BP 01/18/24 1627 127/84     Systolic BP Percentile --      Diastolic BP Percentile --      Pulse Rate 01/18/24 1627 67     Resp 01/18/24 1627 16     Temp 01/18/24 1627 98 F (36.7 C)     Temp src --  SpO2 01/18/24 1627 100 %     Weight 01/18/24 1626 270 lb (122.5 kg)     Height 01/18/24 1626 5\' 5"  (1.651 m)     Head Circumference --      Peak Flow --      Pain Score 01/18/24 1624 4     Pain Loc --      Pain Education --      Exclude from Growth Chart --    No data found.  Updated Vital Signs BP 127/84 (BP Location: Left Arm)   Pulse 67   Temp 98 F (36.7 C)   Resp 16   Ht 5\' 5"  (1.651 m)   Wt 270 lb (122.5 kg)   LMP  (LMP Unknown)   SpO2 100%   BMI 44.93 kg/m   Visual Acuity Right Eye Distance: 20/40 Left Eye Distance: 20/25 Bilateral Distance: 20/20  Physical Exam Vitals and nursing note reviewed.  Constitutional:      General: She is not in acute distress.    Appearance: She is not ill-appearing.   Eyes:     General: Lids are normal. Lids are everted, no foreign bodies appreciated. Vision grossly intact. Gaze aligned appropriately.        Right eye: No foreign body or discharge.        Left eye: No foreign body or discharge.     Extraocular Movements: Extraocular movements intact.     Right eye: Normal extraocular motion.     Left eye: Normal extraocular motion.     Conjunctiva/sclera:     Right eye: Right conjunctiva is injected. No chemosis or hemorrhage.    Left eye: Left conjunctiva is injected. No chemosis or hemorrhage.    Pupils: Pupils are equal, round, and reactive to light.     Comments: Bilat conjunctiva mildly injected  Cardiovascular:     Rate and Rhythm: Normal rate and regular rhythm.     Heart sounds: Normal heart sounds.  Pulmonary:     Effort: Pulmonary effort is normal.     Breath sounds: Normal breath sounds.  Musculoskeletal:     Cervical back: Normal range of motion.  Skin:    General: Skin is warm and dry.  Neurological:     Mental Status: She is alert and oriented to person, place, and time.      UC Treatments / Results  Labs (all labs ordered are listed, but only abnormal results are displayed) Labs Reviewed - No data to display  EKG   Radiology No results found.  Procedures Procedures (including critical care time)  Medications Ordered in UC Medications - No data to display  Initial Impression / Assessment and Plan / UC Course  I have reviewed the triage vital signs and the nursing notes.  Pertinent labs & imaging results that were available during my care of the patient were reviewed by me and considered in my medical decision making (see chart for details).  Vision intact No red flags Viral vs allergic conjunctivitis Olopatadine BID and zyrtec daily Provided with a few options of eye clinics for follow up  Patient was concerned about eye damage or long-term effects. No signs or symptoms of structural damage, but discussed  needs eye exam to fully evaluate. Her symptoms are at this time all superficial/conjunctival and reassurance is provided  Final Clinical Impressions(s) / UC Diagnoses   Final diagnoses:  Conjunctival injection, bilateral     Discharge Instructions      Olopatadine drops twice daily for  1 week  Zyrtec nightly for 1 week  Call [facility] or go to walk[facility]in at [facility] for follow up     ED Prescriptions     Medication Sig Dispense Auth. Provider   olopatadine (PATANOL) 0.1 % ophthalmic solution Place 1 drop into both eyes 2 (two) times daily. 5 mL Shontia Gillooly, PA-C   cetirizine (ZYRTEC ALLERGY) 10 MG tablet Take 1 tablet (10 mg total) by mouth daily. 30 tablet Cassey Hurrell, Lurena Joiner, PA-C      PDMP not reviewed this encounter.   Marlow Baars, [place] 01/18/24 1718

## 2024-01-18 NOTE — Discharge Instructions (Addendum)
Olopatadine drops twice daily for 1 week  Zyrtec nightly for 1 week  Call [facility] or go to walk-in at [facility] for follow up

## 2024-01-19 ENCOUNTER — Ambulatory Visit (HOSPITAL_COMMUNITY): Payer: BC Managed Care – PPO

## 2024-01-19 ENCOUNTER — Ambulatory Visit: Payer: BC Managed Care – PPO

## 2024-07-24 ENCOUNTER — Other Ambulatory Visit: Payer: Self-pay | Admitting: Obstetrics and Gynecology

## 2024-07-24 DIAGNOSIS — Z1231 Encounter for screening mammogram for malignant neoplasm of breast: Secondary | ICD-10-CM

## 2024-08-09 ENCOUNTER — Ambulatory Visit
Admission: RE | Admit: 2024-08-09 | Discharge: 2024-08-09 | Disposition: A | Discharge status: Home / Self Care | Source: Ambulatory Visit | Attending: Obstetrics and Gynecology | Admitting: Obstetrics and Gynecology

## 2024-08-09 DIAGNOSIS — Z1231 Encounter for screening mammogram for malignant neoplasm of breast: Secondary | ICD-10-CM

## 2024-09-23 ENCOUNTER — Ambulatory Visit: Attending: Obstetrics and Gynecology | Admitting: Physical Therapy

## 2024-09-23 ENCOUNTER — Other Ambulatory Visit: Payer: Self-pay

## 2024-09-23 ENCOUNTER — Encounter: Payer: Self-pay | Admitting: Physical Therapy

## 2024-09-23 DIAGNOSIS — R279 Unspecified lack of coordination: Secondary | ICD-10-CM | POA: Diagnosis present

## 2024-09-23 DIAGNOSIS — R293 Abnormal posture: Secondary | ICD-10-CM | POA: Insufficient documentation

## 2024-09-23 DIAGNOSIS — M6281 Muscle weakness (generalized): Secondary | ICD-10-CM | POA: Insufficient documentation

## 2024-09-23 NOTE — Patient Instructions (Signed)

## 2024-09-23 NOTE — Therapy (Unsigned)
 OUTPATIENT PHYSICAL THERAPY FEMALE PELVIC EVALUATION   Patient Name: Cathy Bryant MRN: [id number] DOB:Jul 03, 1971, 53 y.o., female Today's Date: 09/24/2024  END OF SESSION:  PT End of Session - 09/23/24 1653     Visit Number 1    Date for Recertification  03/24/25    Authorization Type BCBS    PT Start Time 1615    PT Stop Time 1656    PT Time Calculation (min) 41 min    Activity Tolerance Patient tolerated treatment well    Behavior During Therapy WFL for tasks assessed/performed          Past Medical History:  Diagnosis Date   Anemia    Hypertension    Seasonal allergies    Past Surgical History:  Procedure Laterality Date   attempted ablation     in MD's Office   CESAREAN SECTION  2011   COLONOSCOPY     DILATION AND CURETTAGE OF UTERUS     In MD's office   ROBOTIC ASSISTED TOTAL HYSTERECTOMY WITH SALPINGECTOMY Bilateral 02/01/2018   Procedure: ROBOTIC ASSISTED TOTAL HYSTERECTOMY WITH SALPINGECTOMY;  Surgeon: Darcel Pool, MD;  Location: [facility];  Service: Gynecology;  Laterality: Bilateral;   TONSILLECTOMY     WISDOM TOOTH EXTRACTION     Patient Active Problem List   Diagnosis Date Noted   Facial paresthesia 10/05/2021   Snoring 10/05/2021   Menorrhagia 02/01/2018   Iron deficiency anemia due to chronic blood loss 07/18/2017   Essential hypertension 01/20/2017   Hyperlipidemia 01/20/2017   Morbid obesity (HCC) 01/20/2017    PCP: Rexanne Ingle, MD   REFERRING PROVIDER: Darcel Pool, MD  REFERRING DIAG: N39.3 (ICD-10-CM) - Stress incontinence (female) (female)  THERAPY DIAG:  Muscle weakness (generalized)  Unspecified lack of coordination  Abnormal posture  Rationale for Evaluation and Treatment: Rehabilitation  ONSET DATE: chronic   SUBJECTIVE:                                                                                                                                                                                           SUBJECTIVE  STATEMENT: Learned about pelvic floor PT recently and asked MD about it and her urinary incontinence and was referred here. Urinary incontinence with stressors with coughing, sneezing, laughing and has urgency related leakage. Feels like she has a moderate loss of urine.   Fluid intake: water  - 32-64oz daily, coffee in morning  FUNCTIONAL LIMITATIONS: sneezing, laughing, coughing, walking to bathroom.   PERTINENT HISTORY:  Medications for current condition: no Surgeries: c-section, hysterectomy  Other: no  Sexual abuse: No  DIAGNOSTIC FINDINGS:  Post-void residual: Voiding Cystourethrogram (VCUG):  Ultrasound: PAIN:  Are you having pain? No PRECAUTIONS: None  RED FLAGS: None   WEIGHT BEARING RESTRICTIONS: No  FALLS:  Has patient fallen in last 6 months? 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[employer]   ACTIVITY LEVEL : low   PLOF: Independent  PATIENT GOALS: to have no leakage    BOWEL MOVEMENT: Pain with bowel movement: No Type of bowel movement:Type (Bristol Stool Scale) 4, Frequency daily with benefiber, and Strain no Fully empty rectum: No Leakage: No                                              Pads: No Fiber supplement/laxative Yes benefiber  URINATION: Pain with urination: No Fully empty bladder: No                                 Post-void dribble: No Stream: Strong Urgency: Yes  Frequency:during the day sometimes every 30 mins                                                          Nocturia: Yes:  2x sometimes 1x   Leakage: Urge to void, Walking to the bathroom, Coughing, Sneezing, and Laughing Pads/briefs: Yes: liners but if has an accident needs larger  INTERCOURSE:  Ability to have vaginal penetration No  Pain with intercourse: none Dryness: No Climax: not painful Marinoff Scale: 0/3  PREGNANCY: Vaginal deliveries 0  C-section deliveries 1 Currently pregnant No  PROLAPSE: Has felt pressure maybe 3x over the past couple years    OBJECTIVE:   Note: Objective measures were completed at Evaluation unless otherwise noted.  DIAGNOSTIC FINDINGS:    COGNITION: Overall cognitive status: Within functional limits for tasks assessed     SENSATION: Light touch: Appears intact  FUNCTIONAL TESTS:   Single leg stance: unstable bil  Rt:  Lt: Sit-up test:0/3 Squat: bil knee valgus and decreased descent    GAIT: WFL  POSTURE: rounded shoulders, forward head, and anterior pelvic tilt   LUMBARAROM/PROM:  A/PROM A/PROM  Eval (% available)  Flexion 50  Extension 75  Right lateral flexion 75  Left lateral flexion 50  Right rotation 75  Left rotation 75   (Blank rows = not tested)  LOWER EXTREMITY ROM:  Bil hamstrings and adductors limited by 25%  LOWER EXTREMITY MMT:  Bil hips grossly 4/5 PALPATION:  General: tightness noted at bil lumbar and thoracic paraspinals however Lt lower paraspinals most tightness and TTP at Lt PSIS, Lt piriformis and glute med  Pelvic Alignment: WFL  Abdominal: no TTP but does have tension at lower quadrants   Breathing: chest Scar tissue: Yes:  c-section scar with notable tension above and below scar site and restrictions noted throughout scar itself                External Perineal Exam: pt deferred requesting to wait on this                             Internal Pelvic Floor: pt deferred   Patient confirms identification and approves PT to assess internal pelvic floor  and treatment No  PELVIC MMT:   MMT eval  Vaginal   Internal Anal Sphincter   External Anal Sphincter   Puborectalis   Diastasis Recti   (Blank rows = not tested)        TONE: Deferred   PROLAPSE: Deferred   TODAY'S TREATMENT:                                                                                                                              DATE:   09/24/24 EVAL Examination completed, findings reviewed, pt educated on POC, HEP, and pelvic floor PT role in care, urge drill. Pt deferred internal  assessment of pelvic floor today and therefore had more time for treatment after evaluation with extra time spent on educating urge drill for making it to bathroom dry first then progressing to spacing out frequency especially in evening as this pt's most frequent urination. Pt motivated to participate in PT and agreeable to attempt recommendations.     PATIENT EDUCATION:  Education details: urge drill, [id number] Person educated: Patient Education method: Explanation, Demonstration, Tactile cues, Verbal cues, and Handouts Education comprehension: verbalized understanding, returned demonstration, verbal cues required, tactile cues required, and needs further education  HOME EXERCISE PROGRAM: [id number]  ASSESSMENT:  CLINICAL IMPRESSION: Patient is a 53 y.o. female  who was seen today for physical therapy evaluation and treatment for urinary incontinence, low back pain. Pt reports back pain intermittently but not consistent and reports at time of eval, Lt sciatic pain flared and does demonstrate decreased flexibility at bil hips, decreased core and hip strength, decreased mobility more so at Lt low back compared to Rt and has TTP at Lt PSIS, piriformis and glute med. Pt also demonstrated weakness in core with sit up test and bil hips with impaired squat mechanics and bed mobility with holding breath and need of hands to sit up from mat table with strain. Pt reports she has urinary incontinence with urge and stressors, and does have increased urinary frequency. Pt deferred internal pelvic floor assessment today but agreeable later if needed . Pt would benefit from additional PT to further address deficits.    OBJECTIVE IMPAIRMENTS: decreased activity tolerance, decreased coordination, decreased endurance, decreased mobility, difficulty walking, decreased ROM, decreased strength, impaired perceived functional ability, increased muscle spasms, impaired flexibility, improper body mechanics, postural  dysfunction, and pain.   ACTIVITY LIMITATIONS: carrying, lifting, standing, squatting, continence, and locomotion level  PARTICIPATION LIMITATIONS: laundry, community activity, occupation, and yard work  PERSONAL FACTORS: Fitness, Time since onset of injury/illness/exacerbation, and 1 comorbidity: medical history are also affecting patient's functional outcome.   REHAB POTENTIAL: Good  CLINICAL DECISION MAKING: Stable/uncomplicated  EVALUATION COMPLEXITY: Low   GOALS: Goals reviewed with patient? Yes  SHORT TERM GOALS: Target date: 10/21/24  Pt to be I with HEP for carry over and continuing recommendations for improved outcomes.   Baseline: Goal status: INITIAL  2.  Pt will be independent with the knack,  urge suppression technique, and double voiding in order to improve bladder habits and decrease urinary incontinence.   Baseline:  Goal status: INITIAL  3.  Pt to demonstrate improved transverse abdominis activation with breathing coordination to improve pelvic stability for tolerating proper mechanics for hip strengthening.  Baseline:  Goal status: INITIAL  4.  Pt to demonstrate improved coordination of pelvic floor and breathing mechanics with body weight squat with appropriate synergistic patterns to decrease pain and leakage at least 50% of the time for improved ability to complete a 30 minute walk without strain at pelvic floor and symptoms.    Baseline:  Goal status: INITIAL   LONG TERM GOALS: Target date: 03/24/24  Pt to be I with advanced HEP for carry over and continuing recommendations for improved outcomes.   Baseline:  Goal status: INITIAL  2.  Pt to report improved time between bladder voids to at least 2 hours for improved QOL with decreased urinary frequency to tolerate grocery store errand without needing to go to bathroom multiple times.   Baseline:  Goal status: INITIAL  3.  Pt to report no more than 1 urinary incontinence in a week for improved confidence  with community outings.  Baseline:  Goal status: INITIAL  4.  Pt to demonstrate improved coordination of pelvic floor and breathing mechanics with 15# squat with appropriate synergistic patterns to decrease pain and leakage at least 75% of the time for improved ability to complete a 30 minute walk without strain at pelvic floor and symptoms.    Baseline:  Goal status: INITIAL  5.  Pt to demonstrate midline upright posture without compensation for at least 30 mins for decreased strain at pelvic floor and decreased urinary incontinence with community outings.  Baseline:  Goal status: INITIAL  PLAN:  PT FREQUENCY: 1-2x/week  PT DURATION: 20 sessions  PLANNED INTERVENTIONS: 97110-Therapeutic exercises, 97530- Therapeutic activity, [id number]- Neuromuscular re-education, 97535- Self Care, [id number]- Manual therapy, [phone]- Canalith repositioning, [id number]- Aquatic Therapy, [phone]- Electrical stimulation (manual), [id number]- Vasopneumatic device, [phone] (1-2 muscles), [id number] (3+ muscles)- Dry Needling, Patient/Family education, Taping, Joint mobilization, Spinal mobilization, Scar mobilization, Vestibular training, DME instructions, Cryotherapy, Moist heat, and Biofeedback  PLAN FOR NEXT SESSION: core and hip strengthening, pelvic floor internal assessment if pt consents, breathing and voiding mechanics.    Darryle Navy, PT, DPT 10/07/257:39 AM  [facility] [address] [place], [place] [address] Phone # [phone] Fax [phone]

## 2024-10-01 ENCOUNTER — Ambulatory Visit: Admitting: Physical Therapy

## 2024-10-01 DIAGNOSIS — M6281 Muscle weakness (generalized): Secondary | ICD-10-CM | POA: Diagnosis not present

## 2024-10-01 DIAGNOSIS — R293 Abnormal posture: Secondary | ICD-10-CM

## 2024-10-01 DIAGNOSIS — R279 Unspecified lack of coordination: Secondary | ICD-10-CM

## 2024-10-01 NOTE — Therapy (Signed)
 OUTPATIENT PHYSICAL THERAPY FEMALE PELVIC TREATMENT   Patient Name: Cathy Bryant MRN: [id number] DOB:[birth year], 53 y.o., female Today's Date: 10/01/2024  END OF SESSION:  PT End of Session - 10/01/24 1450     Visit Number 2    Date for Recertification  03/24/25    Authorization Type [employer]    PT Start Time 1450    PT Stop Time 1529    PT Time Calculation (min) 39 min    Activity Tolerance Patient tolerated treatment well    Behavior During Therapy WFL for tasks assessed/performed          Past Medical History:  Diagnosis Date   Anemia    Hypertension    Seasonal allergies    Past Surgical History:  Procedure Laterality Date   attempted ablation     in MD's Office   CESAREAN SECTION  2011   COLONOSCOPY     DILATION AND CURETTAGE OF UTERUS     In MD's office   ROBOTIC ASSISTED TOTAL HYSTERECTOMY WITH SALPINGECTOMY Bilateral 02/01/2018   Procedure: ROBOTIC ASSISTED TOTAL HYSTERECTOMY WITH SALPINGECTOMY;  Surgeon: Darcel Pool, MD;  Location: [facility];  Service: Gynecology;  Laterality: Bilateral;   TONSILLECTOMY     WISDOM TOOTH EXTRACTION     Patient Active Problem List   Diagnosis Date Noted   Facial paresthesia 10/05/2021   Snoring 10/05/2021   Menorrhagia 02/01/2018   Iron deficiency anemia due to chronic blood loss 07/18/2017   Essential hypertension 01/20/2017   Hyperlipidemia 01/20/2017   Morbid obesity (HCC) 01/20/2017    PCP: Rexanne Ingle, MD   REFERRING PROVIDER: Darcel Pool, MD  REFERRING DIAG: N39.3 (ICD-10-CM) - Stress incontinence (female) (female)  THERAPY DIAG:  Muscle weakness (generalized)  Unspecified lack of coordination  Abnormal posture  Rationale for Evaluation and Treatment: Rehabilitation  ONSET DATE: chronic   SUBJECTIVE:                                                                                                                                                                                           SUBJECTIVE  STATEMENT: Pt reports she went to a wellness clinic and had body work done and has helped back pain a lot and noticed with pain better attempted to do more stretching noticed she has leakage with bending forward and feels like she has limited mobility in fear of leakage.   Fluid intake: water  - 32-64oz daily, coffee in morning  FUNCTIONAL LIMITATIONS: sneezing, laughing, coughing, walking to bathroom.   PERTINENT HISTORY:  Medications for current condition: no Surgeries: c-section, hysterectomy  Other: no  Sexual abuse: No  DIAGNOSTIC FINDINGS:  Post-void  residual: Voiding Cystourethrogram (VCUG):  Ultrasound: PAIN:  Are you having pain? No - no pelvic pain back pain is 2/10 PRECAUTIONS: None  RED FLAGS: None   WEIGHT BEARING RESTRICTIONS: No  FALLS:  Has patient fallen in last 6 months? 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   ACTIVITY LEVEL : low   PLOF: Independent  PATIENT GOALS: to have no leakage    BOWEL MOVEMENT: Pain with bowel movement: No Type of bowel movement:Type (Bristol Stool Scale) 4, Frequency daily with benefiber, and Strain no Fully empty rectum: No Leakage: No                                              Pads: No Fiber supplement/laxative Yes benefiber  URINATION: Pain with urination: No Fully empty bladder: No                                 Post-void dribble: No Stream: Strong Urgency: Yes  Frequency:during the day sometimes every 30 mins                                                          Nocturia: Yes:  2x sometimes 1x   Leakage: Urge to void, Walking to the bathroom, Coughing, Sneezing, and Laughing Pads/briefs: Yes: liners but if has an accident needs larger  INTERCOURSE:  Ability to have vaginal penetration No  Pain with intercourse: none Dryness: No Climax: not painful Marinoff Scale: 0/3  PREGNANCY: Vaginal deliveries 0  C-section deliveries 1 Currently pregnant No  PROLAPSE: Has felt pressure maybe 3x over the  past couple years    OBJECTIVE:  Note: Objective measures were completed at Evaluation unless otherwise noted.  DIAGNOSTIC FINDINGS:    COGNITION: Overall cognitive status: Within functional limits for tasks assessed     SENSATION: Light touch: Appears intact  FUNCTIONAL TESTS:   Single leg stance: unstable bil  Rt:  Lt: Sit-up test:0/3 Squat: bil knee valgus and decreased descent    GAIT: WFL  POSTURE: rounded shoulders, forward head, and anterior pelvic tilt   LUMBARAROM/PROM:  A/PROM A/PROM  Eval (% available)  Flexion 50  Extension 75  Right lateral flexion 75  Left lateral flexion 50  Right rotation 75  Left rotation 75   (Blank rows = not tested)  LOWER EXTREMITY ROM:  Bil hamstrings and adductors limited by 25%  LOWER EXTREMITY MMT:  Bil hips grossly 4/5 PALPATION:  General: tightness noted at bil lumbar and thoracic paraspinals however Lt lower paraspinals most tightness and TTP at Lt PSIS, Lt piriformis and glute med  Pelvic Alignment: WFL  Abdominal: no TTP but does have tension at lower quadrants   Breathing: chest Scar tissue: Yes:  c-section scar with notable tension above and below scar site and restrictions noted throughout scar itself                External Perineal Exam: pt deferred requesting to wait on this                             Internal Pelvic  Floor: pt deferred   Patient confirms identification and approves PT to assess internal pelvic floor and treatment No  PELVIC MMT:   MMT eval  Vaginal   Internal Anal Sphincter   External Anal Sphincter   Puborectalis   Diastasis Recti   (Blank rows = not tested)        TONE: Deferred   PROLAPSE: Deferred   TODAY'S TREATMENT:                                                                                                                              DATE:   09/24/24 EVAL Examination completed, findings reviewed, pt educated on POC, HEP, and pelvic floor PT role in  care, urge drill. Pt deferred internal assessment of pelvic floor today and therefore had more time for treatment after evaluation with extra time spent on educating urge drill for making it to bathroom dry first then progressing to spacing out frequency especially in evening as this pt's most frequent urination. Pt motivated to participate in PT and agreeable to attempt recommendations.    10/01/24:  Patient consented to internal pelvic floor assessment and treatment vaginally this date and found to have decreased strength, endurance, and coordination. Patient benefited from verbal and tactile cues for improved technique with pelvic floor contractions with poor activation noted initially 1/5 then improved to 2/5 with muscle tapping and audible targets for quick flicks.  Dressed with privacy, PT then educated pt on findings and additional ways for improved feedback at home for improved awareness of pelvic floor mobility with visual and hand at vaginal opening to see if pelvic floor activating.  Sitting at towel roll x10 pelvic floor contractions attempted and pt reports she felt contractions slightly but very faint  Hooklying hip abduction green band 2x10 with exhale and attempted pelvic floor contraction for coordination  Hooklying hip flexion alt with green band 2x10 with exhale and pelvic floor contraction 2x10 bridges with exhale Sidelying hip abduction with ball press 2x10 each   PATIENT EDUCATION:  Education details: urge drill, [id number] Person educated: Patient Education method: Explanation, Demonstration, Tactile cues, Verbal cues, and Handouts Education comprehension: verbalized understanding, returned demonstration, verbal cues required, tactile cues required, and needs further education  HOME EXERCISE PROGRAM: [id number]  ASSESSMENT:  CLINICAL IMPRESSION: Patient is a 53 y.o. female  who was seen today for physical therapy treatment for urinary incontinence, low back pain. Pt  consented to internal pelvic floor assessment vaginally today and did have weakness throughout with need of NMRE for muscle activation and coordination. Did improve strength one grade with these techniques however pt reports poor awareness of what pelvic floor is doing. Does feel therapist with internal palpation but unsure what muscles are doing. Pt educated on all findings, then directed in NMRE for coordination of pelvic floor with exercises and coordination of breathing with all exercises. Pt would benefit from additional PT to further address deficits.    OBJECTIVE IMPAIRMENTS: decreased  activity tolerance, decreased coordination, decreased endurance, decreased mobility, difficulty walking, decreased ROM, decreased strength, impaired perceived functional ability, increased muscle spasms, impaired flexibility, improper body mechanics, postural dysfunction, and pain.   ACTIVITY LIMITATIONS: carrying, lifting, standing, squatting, continence, and locomotion level  PARTICIPATION LIMITATIONS: laundry, community activity, occupation, and yard work  PERSONAL FACTORS: Fitness, Time since onset of injury/illness/exacerbation, and 1 comorbidity: medical history are also affecting patient's functional outcome.   REHAB POTENTIAL: Good  CLINICAL DECISION MAKING: Stable/uncomplicated  EVALUATION COMPLEXITY: Low   GOALS: Goals reviewed with patient? Yes  SHORT TERM GOALS: Target date: 10/21/24  Pt to be I with HEP for carry over and continuing recommendations for improved outcomes.   Baseline: Goal status: INITIAL  2.  Pt will be independent with the knack, urge suppression technique, and double voiding in order to improve bladder habits and decrease urinary incontinence.   Baseline:  Goal status: INITIAL  3.  Pt to demonstrate improved transverse abdominis activation with breathing coordination to improve pelvic stability for tolerating proper mechanics for hip strengthening.  Baseline:  Goal  status: INITIAL  4.  Pt to demonstrate improved coordination of pelvic floor and breathing mechanics with body weight squat with appropriate synergistic patterns to decrease pain and leakage at least 50% of the time for improved ability to complete a 30 minute walk without strain at pelvic floor and symptoms.    Baseline:  Goal status: INITIAL   LONG TERM GOALS: Target date: 03/24/24  Pt to be I with advanced HEP for carry over and continuing recommendations for improved outcomes.   Baseline:  Goal status: INITIAL  2.  Pt to report improved time between bladder voids to at least 2 hours for improved QOL with decreased urinary frequency to tolerate grocery store errand without needing to go to bathroom multiple times.   Baseline:  Goal status: INITIAL  3.  Pt to report no more than 1 urinary incontinence in a week for improved confidence with community outings.  Baseline:  Goal status: INITIAL  4.  Pt to demonstrate improved coordination of pelvic floor and breathing mechanics with 15# squat with appropriate synergistic patterns to decrease pain and leakage at least 75% of the time for improved ability to complete a 30 minute walk without strain at pelvic floor and symptoms.    Baseline:  Goal status: INITIAL  5.  Pt to demonstrate midline upright posture without compensation for at least 30 mins for decreased strain at pelvic floor and decreased urinary incontinence with community outings.  Baseline:  Goal status: INITIAL  PLAN:  PT FREQUENCY: 1-2x/week  PT DURATION: 20 sessions  PLANNED INTERVENTIONS: 97110-Therapeutic exercises, 97530- Therapeutic activity, [id number]- Neuromuscular re-education, 97535- Self Care, [id number]- Manual therapy, [phone]- Canalith repositioning, [id number]- Aquatic Therapy, [phone]- Electrical stimulation (manual), [id number]- Vasopneumatic device, [phone] (1-2 muscles), [id number] (3+ muscles)- Dry Needling, Patient/Family education, Taping, Joint mobilization, Spinal mobilization,  Scar mobilization, Vestibular training, DME instructions, Cryotherapy, Moist heat, and Biofeedback  PLAN FOR NEXT SESSION: core and hip strengthening, pelvic floor internal assessment if pt consents, breathing and voiding mechanics.    Darryle Navy, PT, DPT 10/14/253:29 PM  [facility] [address] [place], [place] [address] Phone # [phone] Fax [phone]

## 2024-10-31 ENCOUNTER — Telehealth: Payer: Self-pay | Admitting: Physical Therapy

## 2024-10-31 ENCOUNTER — Ambulatory Visit: Attending: Obstetrics and Gynecology | Admitting: Physical Therapy

## 2024-10-31 DIAGNOSIS — R279 Unspecified lack of coordination: Secondary | ICD-10-CM | POA: Insufficient documentation

## 2024-10-31 DIAGNOSIS — M6281 Muscle weakness (generalized): Secondary | ICD-10-CM | POA: Insufficient documentation

## 2024-10-31 DIAGNOSIS — R293 Abnormal posture: Secondary | ICD-10-CM | POA: Insufficient documentation

## 2024-10-31 NOTE — Telephone Encounter (Signed)
 PT spoke to pt and given upcoming appointments.   Darryle Navy, PT, DPT 11/13/259:12 AM

## 2024-11-06 ENCOUNTER — Ambulatory Visit: Admitting: Physical Therapy

## 2024-11-06 DIAGNOSIS — R293 Abnormal posture: Secondary | ICD-10-CM

## 2024-11-06 DIAGNOSIS — M6281 Muscle weakness (generalized): Secondary | ICD-10-CM | POA: Diagnosis present

## 2024-11-06 DIAGNOSIS — R279 Unspecified lack of coordination: Secondary | ICD-10-CM

## 2024-11-06 NOTE — Patient Instructions (Signed)

## 2024-11-06 NOTE — Therapy (Signed)
 OUTPATIENT PHYSICAL THERAPY FEMALE PELVIC TREATMENT   Patient Name: Cathy Bryant MRN: [id number] DOB:[birth year], 53 y.o., female Today's Date: 11/06/2024  END OF SESSION:  PT End of Session - 11/06/24 0810     Visit Number 3    Date for Recertification  03/24/25    Authorization Type [employer]    PT Start Time 0808   arrival   PT Stop Time 0847    PT Time Calculation (min) 39 min    Activity Tolerance Patient tolerated treatment well    Behavior During Therapy [facility] for tasks assessed/performed           Past Medical History:  Diagnosis Date   Anemia    Hypertension    Seasonal allergies    Past Surgical History:  Procedure Laterality Date   attempted ablation     in MD's Office   CESAREAN SECTION  2011   COLONOSCOPY     DILATION AND CURETTAGE OF UTERUS     In MD's office   ROBOTIC ASSISTED TOTAL HYSTERECTOMY WITH SALPINGECTOMY Bilateral 02/01/2018   Procedure: ROBOTIC ASSISTED TOTAL HYSTERECTOMY WITH SALPINGECTOMY;  Surgeon: Darcel Pool, MD;  Location: [facility];  Service: Gynecology;  Laterality: Bilateral;   TONSILLECTOMY     WISDOM TOOTH EXTRACTION     Patient Active Problem List   Diagnosis Date Noted   Facial paresthesia 10/05/2021   Snoring 10/05/2021   Menorrhagia 02/01/2018   Iron deficiency anemia due to chronic blood loss 07/18/2017   Essential hypertension 01/20/2017   Hyperlipidemia 01/20/2017   Morbid obesity (HCC) 01/20/2017    PCP: Rexanne Ingle, MD   REFERRING PROVIDER: Darcel Pool, MD  REFERRING DIAG: N39.3 (ICD-10-CM) - Stress incontinence (female) (female)  THERAPY DIAG:  Muscle weakness (generalized)  Unspecified lack of coordination  Abnormal posture  Rationale for Evaluation and Treatment: Rehabilitation  ONSET DATE: chronic   SUBJECTIVE:                                                                                                                                                                                            SUBJECTIVE STATEMENT: Seeing less leakage but amount of triggers to test.  Usually large loss of urine with triggers and hasn't had these since last visit, and urge leakage noted too.   Fluid intake: water  - 32-64oz daily, coffee in morning  FUNCTIONAL LIMITATIONS: sneezing, laughing, coughing, walking to bathroom.   PERTINENT HISTORY:  Medications for current condition: no Surgeries: c-section, hysterectomy  Other: no  Sexual abuse: No  DIAGNOSTIC FINDINGS:  Post-void residual: Voiding Cystourethrogram (VCUG):  Ultrasound: PAIN:  Are you having pain? No - no pelvic  pain back pain is 2/10 PRECAUTIONS: None  RED FLAGS: None   WEIGHT BEARING RESTRICTIONS: No  FALLS:  Has patient fallen in last 6 months? No  OCCUPATION: supply corporate treasurer   ACTIVITY LEVEL : low   PLOF: Independent  PATIENT GOALS: to have no leakage    BOWEL MOVEMENT: Pain with bowel movement: No Type of bowel movement:Type (Bristol Stool Scale) 4, Frequency daily with benefiber, and Strain no Fully empty rectum: No Leakage: No                                              Pads: No Fiber supplement/laxative Yes benefiber  URINATION: Pain with urination: No Fully empty bladder: No                                 Post-void dribble: No Stream: Strong Urgency: Yes  Frequency:during the day sometimes every 30 mins                                                          Nocturia: Yes:  2x sometimes 1x   Leakage: Urge to void, Walking to the bathroom, Coughing, Sneezing, and Laughing Pads/briefs: Yes: liners but if has an accident needs larger  INTERCOURSE:  Ability to have vaginal penetration No  Pain with intercourse: none Dryness: No Climax: not painful Marinoff Scale: 0/3  PREGNANCY: Vaginal deliveries 0  C-section deliveries 1 Currently pregnant No  PROLAPSE: Has felt pressure maybe 3x over the past couple years    OBJECTIVE:  Note: Objective measures were completed at  Evaluation unless otherwise noted.  DIAGNOSTIC FINDINGS:    COGNITION: Overall cognitive status: Within functional limits for tasks assessed     SENSATION: Light touch: Appears intact  FUNCTIONAL TESTS:   Single leg stance: unstable bil  Rt:  Lt: Sit-up test:0/3 Squat: bil knee valgus and decreased descent    GAIT: WFL  POSTURE: rounded shoulders, forward head, and anterior pelvic tilt   LUMBARAROM/PROM:  A/PROM A/PROM  Eval (% available)  Flexion 50  Extension 75  Right lateral flexion 75  Left lateral flexion 50  Right rotation 75  Left rotation 75   (Blank rows = not tested)  LOWER EXTREMITY ROM:  Bil hamstrings and adductors limited by 25%  LOWER EXTREMITY MMT:  Bil hips grossly 4/5 PALPATION:  General: tightness noted at bil lumbar and thoracic paraspinals however Lt lower paraspinals most tightness and TTP at Lt PSIS, Lt piriformis and glute med  Pelvic Alignment: WFL  Abdominal: no TTP but does have tension at lower quadrants   Breathing: chest Scar tissue: Yes:  c-section scar with notable tension above and below scar site and restrictions noted throughout scar itself                External Perineal Exam: pt deferred requesting to wait on this                             Internal Pelvic Floor: pt deferred   Patient confirms identification and approves PT to assess internal pelvic floor  and treatment No  PELVIC MMT:   MMT eval  Vaginal   Internal Anal Sphincter   External Anal Sphincter   Puborectalis   Diastasis Recti   (Blank rows = not tested)        TONE: Deferred   PROLAPSE: Deferred   TODAY'S TREATMENT:                                                                                                                              DATE:   09/24/24 EVAL Examination completed, findings reviewed, pt educated on POC, HEP, and pelvic floor PT role in care, urge drill. Pt deferred internal assessment of pelvic floor today and  therefore had more time for treatment after evaluation with extra time spent on educating urge drill for making it to bathroom dry first then progressing to spacing out frequency especially in evening as this pt's most frequent urination. Pt motivated to participate in PT and agreeable to attempt recommendations.    10/01/24:  Patient consented to internal pelvic floor assessment and treatment vaginally this date and found to have decreased strength, endurance, and coordination. Patient benefited from verbal and tactile cues for improved technique with pelvic floor contractions with poor activation noted initially 1/5 then improved to 2/5 with muscle tapping and audible targets for quick flicks.  Dressed with privacy, PT then educated pt on findings and additional ways for improved feedback at home for improved awareness of pelvic floor mobility with visual and hand at vaginal opening to see if pelvic floor activating.  Sitting at towel roll x10 pelvic floor contractions attempted and pt reports she felt contractions slightly but very faint  Hooklying hip abduction green band 2x10 with exhale and attempted pelvic floor contraction for coordination  Hooklying hip flexion alt with green band 2x10 with exhale and pelvic floor contraction 2x10 bridges with exhale Sidelying hip abduction with ball press 2x10 each  11/06/24: Pt educated on urge drill, bladder irritants, and knack handouts given Hooklying transverse abdominis activation with exhale - benefited from max cues and extra time with best techniques shown with ball squeeze in hands 2x10 Alt marching in hooklying with blue band 2x10 exhale and transverse abdominis activation  Bridges 2x10 with exhale  Returned to hooklying transverse abdominis activation with exhale for carry over and pt demonstrated good techniques with minimal cues x10   PATIENT EDUCATION:  Education details: urge drill, [id number] Person educated: Patient Education method:  Explanation, Demonstration, Tactile cues, Verbal cues, and Handouts Education comprehension: verbalized understanding, returned demonstration, verbal cues required, tactile cues required, and needs further education  HOME EXERCISE PROGRAM: [id number]  ASSESSMENT:  CLINICAL IMPRESSION: Patient is a 53 y.o. female  who was seen today for physical therapy treatment for urinary incontinence, low back pain. pt tolerated well with max cues for transverse abdominis activation activation initially but improved at end of session to minimal cues. Pt does need consistent cues for breathing mechanics and coordination with exercise  but with these reported no leakage during session. Denied questions at end of session. Pt would benefit from additional PT to further address deficits.    OBJECTIVE IMPAIRMENTS: decreased activity tolerance, decreased coordination, decreased endurance, decreased mobility, difficulty walking, decreased ROM, decreased strength, impaired perceived functional ability, increased muscle spasms, impaired flexibility, improper body mechanics, postural dysfunction, and pain.   ACTIVITY LIMITATIONS: carrying, lifting, standing, squatting, continence, and locomotion level  PARTICIPATION LIMITATIONS: laundry, community activity, occupation, and yard work  PERSONAL FACTORS: Fitness, Time since onset of injury/illness/exacerbation, and 1 comorbidity: medical history are also affecting patient's functional outcome.   REHAB POTENTIAL: Good  CLINICAL DECISION MAKING: Stable/uncomplicated  EVALUATION COMPLEXITY: Low   GOALS: Goals reviewed with patient? Yes  SHORT TERM GOALS: Target date: 10/21/24  Pt to be I with HEP for carry over and continuing recommendations for improved outcomes.   Baseline: Goal status: INITIAL  2.  Pt will be independent with the knack, urge suppression technique, and double voiding in order to improve bladder habits and decrease urinary incontinence.    Baseline:  Goal status: INITIAL  3.  Pt to demonstrate improved transverse abdominis activation with breathing coordination to improve pelvic stability for tolerating proper mechanics for hip strengthening.  Baseline:  Goal status: INITIAL  4.  Pt to demonstrate improved coordination of pelvic floor and breathing mechanics with body weight squat with appropriate synergistic patterns to decrease pain and leakage at least 50% of the time for improved ability to complete a 30 minute walk without strain at pelvic floor and symptoms.    Baseline:  Goal status: INITIAL   LONG TERM GOALS: Target date: 03/24/24  Pt to be I with advanced HEP for carry over and continuing recommendations for improved outcomes.   Baseline:  Goal status: INITIAL  2.  Pt to report improved time between bladder voids to at least 2 hours for improved QOL with decreased urinary frequency to tolerate grocery store errand without needing to go to bathroom multiple times.   Baseline:  Goal status: INITIAL  3.  Pt to report no more than 1 urinary incontinence in a week for improved confidence with community outings.  Baseline:  Goal status: INITIAL  4.  Pt to demonstrate improved coordination of pelvic floor and breathing mechanics with 15# squat with appropriate synergistic patterns to decrease pain and leakage at least 75% of the time for improved ability to complete a 30 minute walk without strain at pelvic floor and symptoms.    Baseline:  Goal status: INITIAL  5.  Pt to demonstrate midline upright posture without compensation for at least 30 mins for decreased strain at pelvic floor and decreased urinary incontinence with community outings.  Baseline:  Goal status: INITIAL  PLAN:  PT FREQUENCY: 1-2x/week  PT DURATION: 20 sessions  PLANNED INTERVENTIONS: 97110-Therapeutic exercises, 97530- Therapeutic activity, [id number]- Neuromuscular re-education, 97535- Self Care, [id number]- Manual therapy, [phone]- Canalith  repositioning, [id number]- Aquatic Therapy, [phone]- Electrical stimulation (manual), [id number]- Vasopneumatic device, [phone] (1-2 muscles), [id number] (3+ muscles)- Dry Needling, Patient/Family education, Taping, Joint mobilization, Spinal mobilization, Scar mobilization, Vestibular training, DME instructions, Cryotherapy, Moist heat, and Biofeedback  PLAN FOR NEXT SESSION: core and hip strengthening, pelvic floor internal assessment if pt consents, breathing and voiding mechanics.    Darryle Navy, PT, DPT 11/19/258:49 AM  [facility] [address], [address] [place], [place] [address] Phone # [phone] Fax [phone]

## 2024-11-13 ENCOUNTER — Ambulatory Visit: Admitting: Physical Therapy

## 2024-11-13 DIAGNOSIS — M6281 Muscle weakness (generalized): Secondary | ICD-10-CM

## 2024-11-13 DIAGNOSIS — R279 Unspecified lack of coordination: Secondary | ICD-10-CM

## 2024-11-13 DIAGNOSIS — R293 Abnormal posture: Secondary | ICD-10-CM

## 2024-11-13 NOTE — Therapy (Signed)
 OUTPATIENT PHYSICAL THERAPY FEMALE PELVIC TREATMENT   Patient Name: Cathy Bryant MRN: [id number] DOB:10-19-1971, 53 y.o., female Today's Date: 11/13/2024  END OF SESSION:  PT End of Session - 11/13/24 0841     Visit Number 4    Date for Recertification  03/24/25    Authorization Type [employer]    PT Start Time 0801    PT Stop Time 0840    PT Time Calculation (min) 39 min    Activity Tolerance Patient tolerated treatment well    Behavior During Therapy WFL for tasks assessed/performed            Past Medical History:  Diagnosis Date   Anemia    Hypertension    Seasonal allergies    Past Surgical History:  Procedure Laterality Date   attempted ablation     in MD's Office   CESAREAN SECTION  2011   COLONOSCOPY     DILATION AND CURETTAGE OF UTERUS     In MD's office   ROBOTIC ASSISTED TOTAL HYSTERECTOMY WITH SALPINGECTOMY Bilateral 02/01/2018   Procedure: ROBOTIC ASSISTED TOTAL HYSTERECTOMY WITH SALPINGECTOMY;  Surgeon: Darcel Pool, MD;  Location: [facility];  Service: Gynecology;  Laterality: Bilateral;   TONSILLECTOMY     WISDOM TOOTH EXTRACTION     Patient Active Problem List   Diagnosis Date Noted   Facial paresthesia 10/05/2021   Snoring 10/05/2021   Menorrhagia 02/01/2018   Iron deficiency anemia due to chronic blood loss 07/18/2017   Essential hypertension 01/20/2017   Hyperlipidemia 01/20/2017   Morbid obesity (HCC) 01/20/2017    PCP: Rexanne Ingle, MD   REFERRING PROVIDER: Darcel Pool, MD  REFERRING DIAG: N39.3 (ICD-10-CM) - Stress incontinence (female) (female)  THERAPY DIAG:  Muscle weakness (generalized)  Abnormal posture  Unspecified lack of coordination  Rationale for Evaluation and Treatment: Rehabilitation  ONSET DATE: chronic   SUBJECTIVE:                                                                                                                                                                                            SUBJECTIVE STATEMENT: Has been walking more (around an hour) and this has been helping. Urinary incontinence is also getting better, has had some coughing without leakage but these were smaller coughs.   Fluid intake: water  - 32-64oz daily, coffee in morning  FUNCTIONAL LIMITATIONS: sneezing, laughing, coughing, walking to bathroom.   PERTINENT HISTORY:  Medications for current condition: no Surgeries: c-section, hysterectomy  Other: no  Sexual abuse: No  DIAGNOSTIC FINDINGS:  Post-void residual: Voiding Cystourethrogram (VCUG):  Ultrasound: PAIN:  Are you having pain? No - no pelvic pain  back pain is 2/10 PRECAUTIONS: None  RED FLAGS: None   WEIGHT BEARING RESTRICTIONS: No  FALLS:  Has patient fallen in last 6 months? No  OCCUPATION: supply corporate treasurer   ACTIVITY LEVEL : low   PLOF: Independent  PATIENT GOALS: to have no leakage    BOWEL MOVEMENT: Pain with bowel movement: No Type of bowel movement:Type (Bristol Stool Scale) 4, Frequency daily with benefiber, and Strain no Fully empty rectum: No Leakage: No                                              Pads: No Fiber supplement/laxative Yes benefiber  URINATION: Pain with urination: No Fully empty bladder: No                                 Post-void dribble: No Stream: Strong Urgency: Yes  Frequency:during the day sometimes every 30 mins                                                          Nocturia: Yes:  2x sometimes 1x   Leakage: Urge to void, Walking to the bathroom, Coughing, Sneezing, and Laughing Pads/briefs: Yes: liners but if has an accident needs larger  INTERCOURSE:  Ability to have vaginal penetration No  Pain with intercourse: none Dryness: No Climax: not painful Marinoff Scale: 0/3  PREGNANCY: Vaginal deliveries 0  C-section deliveries 1 Currently pregnant No  PROLAPSE: Has felt pressure maybe 3x over the past couple years    OBJECTIVE:  Note: Objective measures were  completed at Evaluation unless otherwise noted.  DIAGNOSTIC FINDINGS:    COGNITION: Overall cognitive status: Within functional limits for tasks assessed     SENSATION: Light touch: Appears intact  FUNCTIONAL TESTS:   Single leg stance: unstable bil  Rt:  Lt: Sit-up test:0/3 Squat: bil knee valgus and decreased descent    GAIT: WFL  POSTURE: rounded shoulders, forward head, and anterior pelvic tilt   LUMBARAROM/PROM:  A/PROM A/PROM  Eval (% available)  Flexion 50  Extension 75  Right lateral flexion 75  Left lateral flexion 50  Right rotation 75  Left rotation 75   (Blank rows = not tested)  LOWER EXTREMITY ROM:  Bil hamstrings and adductors limited by 25%  LOWER EXTREMITY MMT:  Bil hips grossly 4/5 PALPATION:  General: tightness noted at bil lumbar and thoracic paraspinals however Lt lower paraspinals most tightness and TTP at Lt PSIS, Lt piriformis and glute med  Pelvic Alignment: WFL  Abdominal: no TTP but does have tension at lower quadrants   Breathing: chest Scar tissue: Yes:  c-section scar with notable tension above and below scar site and restrictions noted throughout scar itself                External Perineal Exam: pt deferred requesting to wait on this                             Internal Pelvic Floor: pt deferred   Patient confirms identification and approves PT to assess internal pelvic floor and  treatment No  PELVIC MMT:   MMT eval  Vaginal   Internal Anal Sphincter   External Anal Sphincter   Puborectalis   Diastasis Recti   (Blank rows = not tested)        TONE: Deferred   PROLAPSE: Deferred   TODAY'S TREATMENT:                                                                                                                              DATE:   09/24/24 EVAL Examination completed, findings reviewed, pt educated on POC, HEP, and pelvic floor PT role in care, urge drill. Pt deferred internal assessment of pelvic floor today  and therefore had more time for treatment after evaluation with extra time spent on educating urge drill for making it to bathroom dry first then progressing to spacing out frequency especially in evening as this pt's most frequent urination. Pt motivated to participate in PT and agreeable to attempt recommendations.    10/01/24:  Patient consented to internal pelvic floor assessment and treatment vaginally this date and found to have decreased strength, endurance, and coordination. Patient benefited from verbal and tactile cues for improved technique with pelvic floor contractions with poor activation noted initially 1/5 then improved to 2/5 with muscle tapping and audible targets for quick flicks.  Dressed with privacy, PT then educated pt on findings and additional ways for improved feedback at home for improved awareness of pelvic floor mobility with visual and hand at vaginal opening to see if pelvic floor activating.  Sitting at towel roll x10 pelvic floor contractions attempted and pt reports she felt contractions slightly but very faint  Hooklying hip abduction green band 2x10 with exhale and attempted pelvic floor contraction for coordination  Hooklying hip flexion alt with green band 2x10 with exhale and pelvic floor contraction 2x10 bridges with exhale Sidelying hip abduction with ball press 2x10 each  11/06/24: Pt educated on urge drill, bladder irritants, and knack handouts given Hooklying transverse abdominis activation with exhale - benefited from max cues and extra time with best techniques shown with ball squeeze in hands 2x10 Alt marching in hooklying with blue band 2x10 exhale and transverse abdominis activation  Bridges 2x10 with exhale  Returned to hooklying transverse abdominis activation with exhale for carry over and pt demonstrated good techniques with minimal cues x10  11/13/24: Hooklying hip adduction with pelvic floor activation and exhale 2x10 Hooklying opposite hand  and knee ball press 2x10 with exhale and pelvic floor activation Bridges 2x10 with exhale and pelvic floor activation  Hooklying blue band bil shoulder horizontal abduction with transverse abdominis activation and exhale x20 Hooklying hip IR with yoga block between knees 2x10 Sidelying hip abduction with ball press 2x10 each with exhale  Standing OHP x10 each 3# with transverse abdominis activation and exhale  Standing chest press both hand on 3# exhale and transverse abdominis activation 2x10   PATIENT EDUCATION:  Education details: urge drill, [id number]  Person educated: Patient Education method: Explanation, Demonstration, Tactile cues, Verbal cues, and Handouts Education comprehension: verbalized understanding, returned demonstration, verbal cues required, tactile cues required, and needs further education  HOME EXERCISE PROGRAM: [id number]  ASSESSMENT:  CLINICAL IMPRESSION: Patient is a 53 y.o. female  who was seen today for physical therapy treatment for urinary incontinence, low back pain. Pt does continue to have difficulty contracting transverse abdominis but able with cues, able to complete more standing exercise today with cues for coordination of breathing and tactile cues for transverse abdominis activation. Reports having less leakage overall and encouraged to attempt no liners at home to better assess how much leakage she is still having.  Pt would benefit from additional PT to further address deficits.    OBJECTIVE IMPAIRMENTS: decreased activity tolerance, decreased coordination, decreased endurance, decreased mobility, difficulty walking, decreased ROM, decreased strength, impaired perceived functional ability, increased muscle spasms, impaired flexibility, improper body mechanics, postural dysfunction, and pain.   ACTIVITY LIMITATIONS: carrying, lifting, standing, squatting, continence, and locomotion level  PARTICIPATION LIMITATIONS: laundry, community activity, occupation,  and yard work  PERSONAL FACTORS: Fitness, Time since onset of injury/illness/exacerbation, and 1 comorbidity: medical history are also affecting patient's functional outcome.   REHAB POTENTIAL: Good  CLINICAL DECISION MAKING: Stable/uncomplicated  EVALUATION COMPLEXITY: Low   GOALS: Goals reviewed with patient? Yes  SHORT TERM GOALS: Target date: 10/21/24  Pt to be I with HEP for carry over and continuing recommendations for improved outcomes.   Baseline: Goal status: INITIAL  2.  Pt will be independent with the knack, urge suppression technique, and double voiding in order to improve bladder habits and decrease urinary incontinence.   Baseline:  Goal status: INITIAL  3.  Pt to demonstrate improved transverse abdominis activation with breathing coordination to improve pelvic stability for tolerating proper mechanics for hip strengthening.  Baseline:  Goal status: INITIAL  4.  Pt to demonstrate improved coordination of pelvic floor and breathing mechanics with body weight squat with appropriate synergistic patterns to decrease pain and leakage at least 50% of the time for improved ability to complete a 30 minute walk without strain at pelvic floor and symptoms.    Baseline:  Goal status: INITIAL   LONG TERM GOALS: Target date: 03/24/24  Pt to be I with advanced HEP for carry over and continuing recommendations for improved outcomes.   Baseline:  Goal status: INITIAL  2.  Pt to report improved time between bladder voids to at least 2 hours for improved QOL with decreased urinary frequency to tolerate grocery store errand without needing to go to bathroom multiple times.   Baseline:  Goal status: INITIAL  3.  Pt to report no more than 1 urinary incontinence in a week for improved confidence with community outings.  Baseline:  Goal status: INITIAL  4.  Pt to demonstrate improved coordination of pelvic floor and breathing mechanics with 15# squat with appropriate synergistic  patterns to decrease pain and leakage at least 75% of the time for improved ability to complete a 30 minute walk without strain at pelvic floor and symptoms.    Baseline:  Goal status: INITIAL  5.  Pt to demonstrate midline upright posture without compensation for at least 30 mins for decreased strain at pelvic floor and decreased urinary incontinence with community outings.  Baseline:  Goal status: INITIAL  PLAN:  PT FREQUENCY: 1-2x/week  PT DURATION: 20 sessions  PLANNED INTERVENTIONS: 97110-Therapeutic exercises, 97530- Therapeutic activity, [id number]- Neuromuscular re-education, 97535- Self Care, [id number]- Manual therapy, [phone]-  Canalith repositioning, 02886- Aquatic Therapy, [id number]- Electrical stimulation (manual), [id number]- Vasopneumatic device, [phone] (1-2 muscles), [id number] (3+ muscles)- Dry Needling, Patient/Family education, Taping, Joint mobilization, Spinal mobilization, Scar mobilization, Vestibular training, DME instructions, Cryotherapy, Moist heat, and Biofeedback  PLAN FOR NEXT SESSION: core and hip strengthening, pelvic floor internal assessment if pt consents, breathing and voiding mechanics.    Darryle Navy, PT, DPT 11/26/258:42 AM  [facility] [address] [place], [place] [address] Phone # [phone] Fax [phone]

## 2024-11-19 ENCOUNTER — Ambulatory Visit: Attending: Obstetrics and Gynecology | Admitting: Physical Therapy

## 2024-11-19 DIAGNOSIS — R279 Unspecified lack of coordination: Secondary | ICD-10-CM | POA: Insufficient documentation

## 2024-11-19 DIAGNOSIS — R293 Abnormal posture: Secondary | ICD-10-CM | POA: Insufficient documentation

## 2024-11-19 DIAGNOSIS — M6281 Muscle weakness (generalized): Secondary | ICD-10-CM | POA: Insufficient documentation

## 2024-11-19 NOTE — Therapy (Signed)
 OUTPATIENT PHYSICAL THERAPY FEMALE PELVIC TREATMENT   Patient Name: Cathy Bryant MRN: [id number] DOB:11-27-1971, 53 y.o., female Today's Date: 11/19/2024  END OF SESSION:  PT End of Session - 11/19/24 0717     Visit Number 5    Date for Recertification  03/24/25    Authorization Type BCBS    Authorization - Number of Visits 20    PT Start Time 0716    PT Stop Time 0755    PT Time Calculation (min) 39 min    Activity Tolerance Patient tolerated treatment well    Behavior During Therapy [facility] for tasks assessed/performed            Past Medical History:  Diagnosis Date   Anemia    Hypertension    Seasonal allergies    Past Surgical History:  Procedure Laterality Date   attempted ablation     in MD's Office   CESAREAN SECTION  2011   COLONOSCOPY     DILATION AND CURETTAGE OF UTERUS     In MD's office   ROBOTIC ASSISTED TOTAL HYSTERECTOMY WITH SALPINGECTOMY Bilateral 02/01/2018   Procedure: ROBOTIC ASSISTED TOTAL HYSTERECTOMY WITH SALPINGECTOMY;  Surgeon: Darcel Pool, MD;  Location: [facility];  Service: Gynecology;  Laterality: Bilateral;   TONSILLECTOMY     WISDOM TOOTH EXTRACTION     Patient Active Problem List   Diagnosis Date Noted   Facial paresthesia 10/05/2021   Snoring 10/05/2021   Menorrhagia 02/01/2018   Iron deficiency anemia due to chronic blood loss 07/18/2017   Essential hypertension 01/20/2017   Hyperlipidemia 01/20/2017   Morbid obesity (HCC) 01/20/2017    PCP: Rexanne Ingle, MD   REFERRING PROVIDER: Darcel Pool, MD  REFERRING DIAG: N39.3 (ICD-10-CM) - Stress incontinence (female) (female)  THERAPY DIAG:  Muscle weakness (generalized)  Unspecified lack of coordination  Abnormal posture  Rationale for Evaluation and Treatment: Rehabilitation  ONSET DATE: chronic   SUBJECTIVE:                                                                                                                                                                                            SUBJECTIVE STATEMENT: Having urgency and increased frequency still in evenings, does drink more water  in the evenings because doesn't go to restroom at work often, prefers home. Reports did have some urinary incontinence with urge but always once in bed then needs to go again  Fluid intake: water  - 32-64oz daily, coffee in morning  FUNCTIONAL LIMITATIONS: sneezing, laughing, coughing, walking to bathroom.   PERTINENT HISTORY:  Medications for current condition: no Surgeries: c-section, hysterectomy  Other: no  Sexual abuse: No  DIAGNOSTIC FINDINGS:  Post-void residual: Voiding Cystourethrogram (VCUG):  Ultrasound: PAIN:  Are you having pain? No - no pelvic pain back pain is 2/10 PRECAUTIONS: None  RED FLAGS: None   WEIGHT BEARING RESTRICTIONS: No  FALLS:  Has patient fallen in last 6 months? No  OCCUPATION: supply corporate treasurer   ACTIVITY LEVEL : low   PLOF: Independent  PATIENT GOALS: to have no leakage    BOWEL MOVEMENT: Pain with bowel movement: No Type of bowel movement:Type (Bristol Stool Scale) 4, Frequency daily with benefiber, and Strain no Fully empty rectum: No Leakage: No                                              Pads: No Fiber supplement/laxative Yes benefiber  URINATION: Pain with urination: No Fully empty bladder: No                                 Post-void dribble: No Stream: Strong Urgency: Yes  Frequency:during the day sometimes every 30 mins                                                          Nocturia: Yes:  2x sometimes 1x   Leakage: Urge to void, Walking to the bathroom, Coughing, Sneezing, and Laughing Pads/briefs: Yes: liners but if has an accident needs larger  INTERCOURSE:  Ability to have vaginal penetration No  Pain with intercourse: none Dryness: No Climax: not painful Marinoff Scale: 0/3  PREGNANCY: Vaginal deliveries 0  C-section deliveries 1 Currently pregnant No  PROLAPSE: Has  felt pressure maybe 3x over the past couple years    OBJECTIVE:  Note: Objective measures were completed at Evaluation unless otherwise noted.  DIAGNOSTIC FINDINGS:    COGNITION: Overall cognitive status: Within functional limits for tasks assessed     SENSATION: Light touch: Appears intact  FUNCTIONAL TESTS:   Single leg stance: unstable bil  Rt:  Lt: Sit-up test:0/3 Squat: bil knee valgus and decreased descent    GAIT: WFL  POSTURE: rounded shoulders, forward head, and anterior pelvic tilt   LUMBARAROM/PROM:  A/PROM A/PROM  Eval (% available)  Flexion 50  Extension 75  Right lateral flexion 75  Left lateral flexion 50  Right rotation 75  Left rotation 75   (Blank rows = not tested)  LOWER EXTREMITY ROM:  Bil hamstrings and adductors limited by 25%  LOWER EXTREMITY MMT:  Bil hips grossly 4/5 PALPATION:  General: tightness noted at bil lumbar and thoracic paraspinals however Lt lower paraspinals most tightness and TTP at Lt PSIS, Lt piriformis and glute med  Pelvic Alignment: WFL  Abdominal: no TTP but does have tension at lower quadrants   Breathing: chest Scar tissue: Yes:  c-section scar with notable tension above and below scar site and restrictions noted throughout scar itself                External Perineal Exam: pt deferred requesting to wait on this  Internal Pelvic Floor: pt deferred   Patient confirms identification and approves PT to assess internal pelvic floor and treatment No  PELVIC MMT:   MMT 10/14  Vaginal 1/5  Internal Anal Sphincter   External Anal Sphincter   Puborectalis   Diastasis Recti   (Blank rows = not tested)        TONE: Deferred   PROLAPSE: Deferred   TODAY'S TREATMENT:                                                                                                                              DATE:    11/13/24: Hooklying hip adduction with pelvic floor activation and exhale  2x10 Hooklying opposite hand and knee ball press 2x10 with exhale and pelvic floor activation Bridges 2x10 with exhale and pelvic floor activation  Hooklying blue band bil shoulder horizontal abduction with transverse abdominis activation and exhale x20 Hooklying hip IR with yoga block between knees 2x10 Sidelying hip abduction with ball press 2x10 each with exhale  Standing OHP x10 each 3# with transverse abdominis activation and exhale  Standing chest press both hand on 3# exhale and transverse abdominis activation 2x10  11/19/24: Hooklying ball squeeze with hands for transverse abdominis activation and exhale and pelvic floor 2x10 Hooklying pelvic floor contractions with exhale x10 - cues for decreased compensation with core and glutes then good correction reports she can still feel contractions Hooklying red band bil shoulder horizontal abduction 2x10 with exhale and transverse abdominis activation  Bridges 2x10 > x5 green band unilateral adductor resistance with bridge Dead bugs with feet at mat 2x10 - cues for transverse abdominis activation and limiting lumbar arching Hooklying green band pull downs with transverse abdominis activation and exhale 2x10 Sidelying hip abduction with leg lift with exhale 2x10 each Seated pelvic floor contraction with exhale x10 Reviewed urge drill for evening and possibly completing 1-2x to improve control to get to bathroom or delay for more restful sleep    PATIENT EDUCATION:  Education details: urge drill, [id number] Person educated: Patient Education method: Explanation, Demonstration, Tactile cues, Verbal cues, and Handouts Education comprehension: verbalized understanding, returned demonstration, verbal cues required, tactile cues required, and needs further education  HOME EXERCISE PROGRAM: [id number]  ASSESSMENT:  CLINICAL IMPRESSION: Patient is a 53 y.o. female  who was seen today for physical therapy treatment for urinary incontinence, low  back pain. Pt demonstrated improved transverse abdominis activation but does still benefit from cues for techniques. Pt activating pelvic floor better in sitting per pt and reports improvement in all symptoms but are still present. Pt progressing toward all goals. Pt would benefit from additional PT to further address deficits.    OBJECTIVE IMPAIRMENTS: decreased activity tolerance, decreased coordination, decreased endurance, decreased mobility, difficulty walking, decreased ROM, decreased strength, impaired perceived functional ability, increased muscle spasms, impaired flexibility, improper body mechanics, postural dysfunction, and pain.   ACTIVITY LIMITATIONS: carrying, lifting, standing, squatting, continence, and locomotion level  PARTICIPATION  LIMITATIONS: laundry, community activity, occupation, and yard work  PERSONAL FACTORS: Fitness, Time since onset of injury/illness/exacerbation, and 1 comorbidity: medical history are also affecting patient's functional outcome.   REHAB POTENTIAL: Good  CLINICAL DECISION MAKING: Stable/uncomplicated  EVALUATION COMPLEXITY: Low   GOALS: Goals reviewed with patient? Yes  SHORT TERM GOALS: Target date: 10/21/24  Pt to be I with HEP for carry over and continuing recommendations for improved outcomes.   Baseline: Goal status: MET 12/2  2.  Pt will be independent with the knack, urge suppression technique, and double voiding in order to improve bladder habits and decrease urinary incontinence.   Baseline:  Goal status: MET 12/2  3.  Pt to demonstrate improved transverse abdominis activation with breathing coordination to improve pelvic stability for tolerating proper mechanics for hip strengthening.  Baseline:  Goal status: MET 12/2  4.  Pt to demonstrate improved coordination of pelvic floor and breathing mechanics with body weight squat with appropriate synergistic patterns to decrease pain and leakage at least 50% of the time for improved  ability to complete a 30 minute walk without strain at pelvic floor and symptoms.    Baseline:  Goal status: MET 12/2   LONG TERM GOALS: Target date: 03/24/24  Pt to be I with advanced HEP for carry over and continuing recommendations for improved outcomes.   Baseline:  Goal status: on going 12/2  2.  Pt to report improved time between bladder voids to at least 2 hours for improved QOL with decreased urinary frequency to tolerate grocery store errand without needing to go to bathroom multiple times.   Baseline:  Goal status: on going 12/2  3.  Pt to report no more than 1 urinary incontinence in a week for improved confidence with community outings.  Baseline:  Goal status: on going 12/2  4.  Pt to demonstrate improved coordination of pelvic floor and breathing mechanics with 15# squat with appropriate synergistic patterns to decrease pain and leakage at least 75% of the time for improved ability to complete a 30 minute walk without strain at pelvic floor and symptoms.    Baseline:  Goal status: on going 12/2  5.  Pt to demonstrate midline upright posture without compensation for at least 30 mins for decreased strain at pelvic floor and decreased urinary incontinence with community outings.  Baseline:  Goal status: on going 12/2  PLAN:  PT FREQUENCY: 1-2x/week  PT DURATION: 20 sessions  PLANNED INTERVENTIONS: 97110-Therapeutic exercises, 97530- Therapeutic activity, 97112- Neuromuscular re-education, 97535- Self Care, 02859- Manual therapy, [phone]- Canalith repositioning, [id number]- Aquatic Therapy, [phone]- Electrical stimulation (manual), [id number]- Vasopneumatic device, [phone] (1-2 muscles), [id number] (3+ muscles)- Dry Needling, Patient/Family education, Taping, Joint mobilization, Spinal mobilization, Scar mobilization, Vestibular training, DME instructions, Cryotherapy, Moist heat, and Biofeedback  PLAN FOR NEXT SESSION: core and hip strengthening, pelvic floor internal assessment if pt  consents, breathing and voiding mechanics.    Darryle Navy, PT, DPT 12/02/258:18 AM  [facility] [address] [place], [place] [address] Phone # [phone] Fax [phone]

## 2024-11-26 ENCOUNTER — Ambulatory Visit: Admitting: Physical Therapy

## 2024-12-03 ENCOUNTER — Ambulatory Visit: Admitting: Physical Therapy

## 2024-12-03 DIAGNOSIS — R279 Unspecified lack of coordination: Secondary | ICD-10-CM

## 2024-12-03 DIAGNOSIS — M6281 Muscle weakness (generalized): Secondary | ICD-10-CM

## 2024-12-03 DIAGNOSIS — R293 Abnormal posture: Secondary | ICD-10-CM

## 2024-12-03 NOTE — Therapy (Signed)
 OUTPATIENT PHYSICAL THERAPY FEMALE PELVIC TREATMENT   Patient Name: Cathy Bryant MRN: [id number] DOB:1971/08/17, 53 y.o., female Today's Date: 12/03/2024  END OF SESSION:  PT End of Session - 12/03/24 0811     Visit Number 6    Date for Recertification  03/24/25    Authorization Type BCBS    Authorization - Number of Visits 20    PT Start Time 0715    PT Stop Time 0754    PT Time Calculation (min) 39 min    Activity Tolerance Patient tolerated treatment well    Behavior During Therapy WFL for tasks assessed/performed             Past Medical History:  Diagnosis Date   Anemia    Hypertension    Seasonal allergies    Past Surgical History:  Procedure Laterality Date   attempted ablation     in MD's Office   CESAREAN SECTION  2011   COLONOSCOPY     DILATION AND CURETTAGE OF UTERUS     In MD's office   ROBOTIC ASSISTED TOTAL HYSTERECTOMY WITH SALPINGECTOMY Bilateral 02/01/2018   Procedure: ROBOTIC ASSISTED TOTAL HYSTERECTOMY WITH SALPINGECTOMY;  Surgeon: Darcel Pool, MD;  Location: [facility];  Service: Gynecology;  Laterality: Bilateral;   TONSILLECTOMY     WISDOM TOOTH EXTRACTION     Patient Active Problem List   Diagnosis Date Noted   Facial paresthesia 10/05/2021   Snoring 10/05/2021   Menorrhagia 02/01/2018   Iron deficiency anemia due to chronic blood loss 07/18/2017   Essential hypertension 01/20/2017   Hyperlipidemia 01/20/2017   Morbid obesity (HCC) 01/20/2017    PCP: Rexanne Ingle, MD   REFERRING PROVIDER: Darcel Pool, MD  REFERRING DIAG: N39.3 (ICD-10-CM) - Stress incontinence (female) (female)  THERAPY DIAG:  Muscle weakness (generalized)  Unspecified lack of coordination  Abnormal posture  Rationale for Evaluation and Treatment: Rehabilitation  ONSET DATE: chronic   SUBJECTIVE:                                                                                                                                                                                            SUBJECTIVE STATEMENT: Has been working out more and is sore today. But having much less leakage. Has been doing urge drill and it helps but that first AM void is still very difficult, needs to do drill 4x sometimes to get there and then is dry but needs to stop a lot. Does still have urgency but improving, frequency is around an hour now and is spacing out more water  now than all at once at night. No urinary incontinence recently.  Fluid intake: water  - 32-64oz daily, coffee in morning  FUNCTIONAL LIMITATIONS: sneezing, laughing, coughing, walking to bathroom.   PERTINENT HISTORY:  Medications for current condition: no Surgeries: c-section, hysterectomy  Other: no  Sexual abuse: No  DIAGNOSTIC FINDINGS:  Post-void residual: Voiding Cystourethrogram (VCUG):  Ultrasound: PAIN:  Are you having pain? No - no pelvic pain back pain is 2/10 PRECAUTIONS: None  RED FLAGS: None   WEIGHT BEARING RESTRICTIONS: No  FALLS:  Has patient fallen in last 6 months? No  OCCUPATION: supply corporate treasurer   ACTIVITY LEVEL : low   PLOF: Independent  PATIENT GOALS: to have no leakage    BOWEL MOVEMENT: Pain with bowel movement: No Type of bowel movement:Type (Bristol Stool Scale) 4, Frequency daily with benefiber, and Strain no Fully empty rectum: No Leakage: No                                              Pads: No Fiber supplement/laxative Yes benefiber  URINATION: Pain with urination: No Fully empty bladder: No                                 Post-void dribble: No Stream: Strong Urgency: Yes  Frequency:during the day sometimes every 30 mins                                                          Nocturia: Yes:  2x sometimes 1x   Leakage: Urge to void, Walking to the bathroom, Coughing, Sneezing, and Laughing Pads/briefs: Yes: liners but if has an accident needs larger  INTERCOURSE:  Ability to have vaginal penetration No  Pain with  intercourse: none Dryness: No Climax: not painful Marinoff Scale: 0/3  PREGNANCY: Vaginal deliveries 0  C-section deliveries 1 Currently pregnant No  PROLAPSE: Has felt pressure maybe 3x over the past couple years    OBJECTIVE:  Note: Objective measures were completed at Evaluation unless otherwise noted.  DIAGNOSTIC FINDINGS:    COGNITION: Overall cognitive status: Within functional limits for tasks assessed     SENSATION: Light touch: Appears intact  FUNCTIONAL TESTS:   Single leg stance: unstable bil  Rt:  Lt: Sit-up test:0/3 Squat: bil knee valgus and decreased descent    GAIT: WFL  POSTURE: rounded shoulders, forward head, and anterior pelvic tilt   LUMBARAROM/PROM:  A/PROM A/PROM  Eval (% available)  Flexion 50  Extension 75  Right lateral flexion 75  Left lateral flexion 50  Right rotation 75  Left rotation 75   (Blank rows = not tested)  LOWER EXTREMITY ROM:  Bil hamstrings and adductors limited by 25%  LOWER EXTREMITY MMT:  Bil hips grossly 4/5 PALPATION:  General: tightness noted at bil lumbar and thoracic paraspinals however Lt lower paraspinals most tightness and TTP at Lt PSIS, Lt piriformis and glute med  Pelvic Alignment: WFL  Abdominal: no TTP but does have tension at lower quadrants   Breathing: chest Scar tissue: Yes:  c-section scar with notable tension above and below scar site and restrictions noted throughout scar itself  External Perineal Exam: pt deferred requesting to wait on this                             Internal Pelvic Floor: pt deferred   Patient confirms identification and approves PT to assess internal pelvic floor and treatment No  PELVIC MMT:   MMT 10/14  Vaginal 1/5  Internal Anal Sphincter   External Anal Sphincter   Puborectalis   Diastasis Recti   (Blank rows = not tested)        TONE: Deferred   PROLAPSE: Deferred   TODAY'S TREATMENT:                                                                                                                               DATE:    11/13/24: Hooklying hip adduction with pelvic floor activation and exhale 2x10 Hooklying opposite hand and knee ball press 2x10 with exhale and pelvic floor activation Bridges 2x10 with exhale and pelvic floor activation  Hooklying blue band bil shoulder horizontal abduction with transverse abdominis activation and exhale x20 Hooklying hip IR with yoga block between knees 2x10 Sidelying hip abduction with ball press 2x10 each with exhale  Standing OHP x10 each 3# with transverse abdominis activation and exhale  Standing chest press both hand on 3# exhale and transverse abdominis activation 2x10  11/19/24: Hooklying ball squeeze with hands for transverse abdominis activation and exhale and pelvic floor 2x10 Hooklying pelvic floor contractions with exhale x10 - cues for decreased compensation with core and glutes then good correction reports she can still feel contractions Hooklying red band bil shoulder horizontal abduction 2x10 with exhale and transverse abdominis activation  Bridges 2x10 > x5 green band unilateral adductor resistance with bridge Dead bugs with feet at mat 2x10 - cues for transverse abdominis activation and limiting lumbar arching Hooklying green band pull downs with transverse abdominis activation and exhale 2x10 Sidelying hip abduction with leg lift with exhale 2x10 each Seated pelvic floor contraction with exhale x10 Reviewed urge drill for evening and possibly completing 1-2x to improve control to get to bathroom or delay for more restful sleep  12/06/24: Bridges 2x10 with exhale and pelvic floor activation X10 pelvic floor with ball squeeze in supine Opposite hand and knee ball press 2x10 Bil shoulder horizontal abduction blue band hooklying with exhale and transverse abdominis activation 2x10 Sit to stands with 9# 2x10 with exhale and pelvic floor activation (did report  she felt the urge to urinate after 4 reps but improved) Piriformis stretch 3x30s Seated happy baby 3x30s  PATIENT EDUCATION:  Education details: urge drill, [id number] Person educated: Patient Education method: Explanation, Demonstration, Tactile cues, Verbal cues, and Handouts Education comprehension: verbalized understanding, returned demonstration, verbal cues required, tactile cues required, and needs further education  HOME EXERCISE PROGRAM: [id number]  ASSESSMENT:  CLINICAL IMPRESSION: Patient is a 53 y.o. female  who was seen today for  physical therapy treatment for urinary incontinence, low back pain. Pt reports improvement with all symptoms to having no recent urinary incontinence however does still have increased urinary frequency at every 1 hour during the day, and urgency mostly in morning but is still occurring regularly. Pt would benefit from additional PT to further address deficits.    OBJECTIVE IMPAIRMENTS: decreased activity tolerance, decreased coordination, decreased endurance, decreased mobility, difficulty walking, decreased ROM, decreased strength, impaired perceived functional ability, increased muscle spasms, impaired flexibility, improper body mechanics, postural dysfunction, and pain.   ACTIVITY LIMITATIONS: carrying, lifting, standing, squatting, continence, and locomotion level  PARTICIPATION LIMITATIONS: laundry, community activity, occupation, and yard work  PERSONAL FACTORS: Fitness, Time since onset of injury/illness/exacerbation, and 1 comorbidity: medical history are also affecting patient's functional outcome.   REHAB POTENTIAL: Good  CLINICAL DECISION MAKING: Stable/uncomplicated  EVALUATION COMPLEXITY: Low   GOALS: Goals reviewed with patient? Yes  SHORT TERM GOALS: Target date: 10/21/24  Pt to be I with HEP for carry over and continuing recommendations for improved outcomes.   Baseline: Goal status: MET 12/2  2.  Pt will be independent with  the knack, urge suppression technique, and double voiding in order to improve bladder habits and decrease urinary incontinence.   Baseline:  Goal status: MET 12/2  3.  Pt to demonstrate improved transverse abdominis activation with breathing coordination to improve pelvic stability for tolerating proper mechanics for hip strengthening.  Baseline:  Goal status: MET 12/2  4.  Pt to demonstrate improved coordination of pelvic floor and breathing mechanics with body weight squat with appropriate synergistic patterns to decrease pain and leakage at least 50% of the time for improved ability to complete a 30 minute walk without strain at pelvic floor and symptoms.    Baseline:  Goal status: MET 12/2   LONG TERM GOALS: Target date: 03/24/24  Pt to be I with advanced HEP for carry over and continuing recommendations for improved outcomes.   Baseline:  Goal status: on going 12/16  2.  Pt to report improved time between bladder voids to at least 2 hours for improved QOL with decreased urinary frequency to tolerate grocery store errand without needing to go to bathroom multiple times.   Baseline:  Goal status: on going 12/16  3.  Pt to report no more than 1 urinary incontinence in a week for improved confidence with community outings.  Baseline:  Goal status: MET 12/16  4.  Pt to demonstrate improved coordination of pelvic floor and breathing mechanics with 15# squat with appropriate synergistic patterns to decrease pain and leakage at least 75% of the time for improved ability to complete a 30 minute walk without strain at pelvic floor and symptoms.    Baseline:  Goal status: on going 12/16 (9#)  5.  Pt to demonstrate midline upright posture without compensation for at least 30 mins for decreased strain at pelvic floor and decreased urinary incontinence with community outings.  Baseline:  Goal status: on going 12/16  PLAN:  PT FREQUENCY: 1-2x/week  PT DURATION: 20 sessions  PLANNED  INTERVENTIONS: 97110-Therapeutic exercises, 97530- Therapeutic activity, 97112- Neuromuscular re-education, 97535- Self Care, 02859- Manual therapy, [phone]- Canalith repositioning, [id number]- Aquatic Therapy, [phone]- Electrical stimulation (manual), 97016- Vasopneumatic device, [phone] (1-2 muscles), 20561 (3+ muscles)- Dry Needling, Patient/Family education, Taping, Joint mobilization, Spinal mobilization, Scar mobilization, Vestibular training, DME instructions, Cryotherapy, Moist heat, and Biofeedback  PLAN FOR NEXT SESSION: core and hip strengthening, pelvic floor internal assessment if pt consents, breathing and voiding mechanics.  Darryle Navy, PT, DPT 12/16/20258:12 AM  [facility] [address] [place], [place] [address] Phone # [phone] Fax [phone]

## 2024-12-10 ENCOUNTER — Encounter: Admitting: Physical Therapy

## 2024-12-16 ENCOUNTER — Encounter
